# Patient Record
Sex: Female | Born: 1935
Health system: Southern US, Community
[De-identification: ages and names within clinical notes are randomized; demographics above are authoritative.]

## PROBLEM LIST (undated history)

## (undated) DIAGNOSIS — M1990 Unspecified osteoarthritis, unspecified site: Secondary | ICD-10-CM

## (undated) DIAGNOSIS — E663 Overweight: Secondary | ICD-10-CM

## (undated) DIAGNOSIS — I10 Essential (primary) hypertension: Secondary | ICD-10-CM

## (undated) DIAGNOSIS — R002 Palpitations: Secondary | ICD-10-CM

## (undated) DIAGNOSIS — I491 Atrial premature depolarization: Secondary | ICD-10-CM

## (undated) HISTORY — PX: OTHER SURGICAL HISTORY: SHX169

## (undated) HISTORY — PX: CHOLECYSTECTOMY: SHX55

## (undated) HISTORY — DX: Essential (primary) hypertension: I10

## (undated) HISTORY — DX: Palpitations: R00.2

## (undated) HISTORY — DX: Overweight: E66.3

## (undated) HISTORY — PX: TOTAL ABDOMINAL HYSTERECTOMY: SHX209

## (undated) HISTORY — DX: Unspecified osteoarthritis, unspecified site: M19.90

## (undated) HISTORY — DX: Atrial premature depolarization: I49.1

---

## 2003-10-09 ENCOUNTER — Encounter: Payer: Self-pay | Admitting: Cardiology

## 2004-11-27 ENCOUNTER — Ambulatory Visit: Payer: Self-pay | Admitting: Internal Medicine

## 2005-12-15 ENCOUNTER — Ambulatory Visit: Payer: Self-pay | Admitting: Internal Medicine

## 2007-01-19 ENCOUNTER — Ambulatory Visit: Payer: Self-pay | Admitting: Internal Medicine

## 2007-07-31 ENCOUNTER — Encounter: Payer: Self-pay | Admitting: Cardiology

## 2007-08-09 ENCOUNTER — Ambulatory Visit: Payer: Self-pay | Admitting: Internal Medicine

## 2008-01-22 ENCOUNTER — Ambulatory Visit: Payer: Self-pay | Admitting: Internal Medicine

## 2008-09-09 ENCOUNTER — Ambulatory Visit: Payer: Self-pay | Admitting: Internal Medicine

## 2009-02-17 ENCOUNTER — Ambulatory Visit: Payer: Self-pay | Admitting: Internal Medicine

## 2009-02-27 ENCOUNTER — Ambulatory Visit: Payer: Self-pay | Admitting: Internal Medicine

## 2009-08-04 ENCOUNTER — Encounter (INDEPENDENT_AMBULATORY_CARE_PROVIDER_SITE_OTHER): Payer: Self-pay | Admitting: *Deleted

## 2009-08-04 ENCOUNTER — Encounter: Payer: Self-pay | Admitting: Cardiology

## 2009-08-04 LAB — CONVERTED CEMR LAB
AST: 19 units/L
Alkaline Phosphatase: 73 units/L
Calcium: 10 mg/dL
Glucose, Bld: 90 mg/dL
HCT: 38.3 %
HDL: 53.2 mg/dL
LDL Cholesterol: 118.4 mg/dL
MCV: 91.6 fL
Monocytes Relative: 9 %
RDW: 13.8 %
Sodium: 140 meq/L
TSH: 1.321 microintl units/mL
Total Bilirubin: 0.6 mg/dL

## 2009-08-11 ENCOUNTER — Encounter: Payer: Self-pay | Admitting: Cardiology

## 2009-08-26 ENCOUNTER — Ambulatory Visit: Payer: Self-pay | Admitting: Internal Medicine

## 2009-09-04 ENCOUNTER — Ambulatory Visit: Payer: Self-pay | Admitting: Cardiology

## 2009-09-04 ENCOUNTER — Encounter (INDEPENDENT_AMBULATORY_CARE_PROVIDER_SITE_OTHER): Payer: Self-pay | Admitting: *Deleted

## 2009-09-04 DIAGNOSIS — E663 Overweight: Secondary | ICD-10-CM | POA: Insufficient documentation

## 2009-09-04 DIAGNOSIS — I10 Essential (primary) hypertension: Secondary | ICD-10-CM | POA: Insufficient documentation

## 2009-09-04 DIAGNOSIS — Z8679 Personal history of other diseases of the circulatory system: Secondary | ICD-10-CM | POA: Insufficient documentation

## 2009-09-19 ENCOUNTER — Ambulatory Visit: Payer: Self-pay | Admitting: Cardiovascular Disease

## 2009-09-19 ENCOUNTER — Encounter: Payer: Self-pay | Admitting: Cardiology

## 2009-09-19 ENCOUNTER — Ambulatory Visit: Payer: Self-pay

## 2009-09-19 ENCOUNTER — Ambulatory Visit (HOSPITAL_COMMUNITY): Admission: RE | Admit: 2009-09-19 | Discharge: 2009-09-19 | Payer: Self-pay | Admitting: Cardiology

## 2009-10-13 ENCOUNTER — Encounter: Payer: Self-pay | Admitting: Cardiology

## 2009-10-14 ENCOUNTER — Ambulatory Visit: Payer: Self-pay | Admitting: Cardiology

## 2010-03-17 NOTE — Letter (Signed)
Summary: [facility]  - 2005  Myoview - 2005   Imported By: Roderic Ovens 09/15/2009 12:50:29  _____________________________________________________________________  External Attachment:    Type:   Image     Comment:   External Document

## 2010-03-17 NOTE — Assessment & Plan Note (Signed)
Summary: np6/cardiac eval/jml   Visit Type:  Initial Consult Primary Provider:  Conchita Paris, MD  CC:  palpitations.  History of Present Illness: The patient is seen for the evaluation of palpitations.  She describes a sensation of feeling an increase in her heart rate.  She did not get chest pain or shortness of breath.  She mentions that in the past she has taken an increased dose of beta blocker for this and that it has helped.  She is warned monitors in the remote past.  There is no proven coronary disease.  She had a nuclear stress study in 2009 showing a normal ejection fraction and no sign of ischemia.    Current Medications (verified): 1)  Metoprolol Succinate 50 Mg Xr24h-Tab (Metoprolol Succinate) .... Take One Tablet By Mouth in The Am and 1/2 in The Pm 2)  Maxzide-25 37.5-25 Mg Tabs (Triamterene-Hctz) .... Take 1 Tablet By Mouth Once A Day 3)  Osteo Bi-Flex Regular Strength 250-200 Mg Tabs (Glucosamine-Chondroitin) .... Two Times A Day  Allergies (verified): 1)  ! * Bactroban  Past History:  Past Medical History: PACs Palpitations Hypertension Arthritis  degenerative Hysterectomy Cholecystectomy  Family History: Family History of Coronary Artery Disease:  Family History of Diabetes:  Family History of Hypertension:   Review of Systems       Patient denies fever, chills, headache, sweats, rash, change in vision, change in hearing,chest pain, cough, nausea vomiting, urinary symptoms.  All other systems are reviewed and are negative.  Vital Signs:  Patient profile:   77 year old female Height:      66 inches Weight:      223 pounds BMI:     36.12 Pulse rate:   84 / minute BP sitting:   152 / 82  (left arm) Cuff size:   regular  Vitals Entered By: Hardin Negus, RMA (September 04, 2009 11:43 AM)  Physical Exam  General:  patient is overweight but stable. Head:  head is atraumatic. Eyes:  no xanthelasma. Neck:  no jugular venous distention. Chest Wall:  no chest  wall tenderness. Lungs:  lungs are clear.  Respiratory effort is nonlabored. Heart:  cardiac exam reveals S1-S2.  No clicks or significant murmurs. Abdomen:  abdomen is soft. Msk:  no musculoskeletal deformities. Extremities:  no peripheral edema.  She is wearing support hose. Skin:  no skin rashes. Psych:  patient is oriented to person time and place.  Affect is normal.   Impression & Recommendations:  Problem # 1:  PALPITATIONS, HX OF (ICD-V12.50)  Orders: EKG w/ Interpretation (93000) Echocardiogram (Echo) EKG is done and reviewed by me.  I've also compared to prior outside tracings.  She is normal sinus rhythm with first degree AV block.  There is left axis deviation.  There is increased R wave in V1.  It seems that the patient's symptoms may be from sinus tachycardia.  I've chosen not to place a monitor at this time.  We will increase her metoprolol to 50 mg b.i.d.  I will proceed with 2-D echo to reassess LV and RV function.  Recent labs show that the patient's TSH is normal.  Potassium is 4.3 and creatinine 0.8. I will see her for follow.  Problem # 2:  OVERWEIGHT/OBESITY (ICD-278.02) Weight loss would be helpful.  Problem # 3:  HYPERTENSION, UNSPECIFIED (ICD-401.9) Blood pressure is stable today.  Resting heart rate is mildly increased.  We will follow her blood pressure heart rate as we increase her beta blocker dose.  Patient Instructions: 1)  Increase Metoprolol ER to 50mg  two times a day  2)  Your physician has requested that you have an echocardiogram.  Echocardiography is a painless test that uses sound waves to create images of your heart. It provides your doctor with information about the size and shape of your heart and how well your heart's chambers and valves are working.  This procedure takes approximately one hour. There are no restrictions for this procedure. 3)  Follow up in about 6 weeks Prescriptions: METOPROLOL SUCCINATE 50 MG XR24H-TAB (METOPROLOL SUCCINATE)  Take 1 tablet by mouth two times a day  #60 x 6   Entered by:   Meredith Staggers, RN   Authorized by:   Talitha Givens, MD, [facility]   Signed by:   Meredith Staggers, RN on 09/04/2009   Method used:   Electronically to        [facility] [phone]* (retail)       [address]       [place], [place]  [address]       Ph: [phone]       Fax: [phone]   RxID:   [id number]

## 2010-03-17 NOTE — Miscellaneous (Signed)
  Clinical Lists Changes  Observations: Added new observation of PAST MED HX: PACs  (as of 08/2009 holters in past ) Palpitations Hypertension Arthritis  degenerative Hysterectomy Cholecystectomy Nuclear...2009.Marland KitchenMarland KitchenEF normal...no ischemia EF   55-60%...echo..09/19/2009....focal basal hypertrophy Overweight (10/13/2009 9:02) Added new observation of PRIMARY MD: Conchita Paris, MD (10/13/2009 9:02)       Past History:  Past Medical History: PACs  (as of 08/2009 holters in past ) Palpitations Hypertension Arthritis  degenerative Hysterectomy Cholecystectomy Nuclear...2009.Marland KitchenMarland KitchenEF normal...no ischemia EF   55-60%...echo..09/19/2009....focal basal hypertrophy Overweight

## 2010-03-17 NOTE — Letter (Signed)
Summary: [facility] Annual Exam Note   [facility] Exam Note   Imported By: Roderic Ovens 09/15/2009 12:43:43  _____________________________________________________________________  External Attachment:    Type:   Image     Comment:   External Document

## 2010-03-17 NOTE — Assessment & Plan Note (Signed)
Summary: F/U ECHO/D.MILLER   Visit Type:  Follow-up Primary Marsela Kuan:  Kelly Paris, MD  CC:  palpitations.  History of Present Illness: The patient is seen for followup of palpitations.  I saw her on September 04, 2009.  We decided that time to increase the dose of her beta blocker and do a 2-D echo.  The echo revealed normal LV function with an ejection fraction of 60%.  Right ventricular function was normal.  The patient tells me today that she noted that some of her symptoms were related to caffeine intake.  She has cut back on her caffeine and she clearly feels better.  Current Medications (verified): 1)  Metoprolol Succinate 50 Mg Xr24h-Tab (Metoprolol Succinate) .... Take 1 Tablet By Mouth Two Times A Day 2)  Maxzide-25 37.5-25 Mg Tabs (Triamterene-Hctz) .... Take 1 Tablet By Mouth Once A Day 3)  Osteo Bi-Flex Regular Strength 250-200 Mg Tabs (Glucosamine-Chondroitin) .... Two Times A Day  Allergies (verified): 1)  ! * Bactroban  Past History:  Past Medical History: PACs  (as of 08/2009 holters in past ) Palpitations   July, 2011... increase metoprolol to decrease caffeine... improved Hypertension Arthritis  degenerative Hysterectomy Cholecystectomy Nuclear...2009.Marland KitchenMarland KitchenEF normal...no ischemia EF   55-60%...echo..09/19/2009....focal basal hypertrophy Overweight  Review of Systems       Patient denies fever, chills, headache, sweats, rash, change in vision, change in hearing, chest pain, cough, nausea vomiting, urinary symptoms.  All other systems are reviewed and are negative.  Vital Signs:  Patient profile:   77 year old female Height:      66 inches Weight:      225 pounds BMI:     36.45 Pulse rate:   78 / minute BP sitting:   132 / 84  (left arm) Cuff size:   regular  Vitals Entered By: Hardin Negus, RMA (October 14, 2009 10:07 AM)  Physical Exam  General:  patient is stable today. Eyes:  no xanthelasma. Neck:  no jugular venous distention. Lungs:  lungs are clear  cardiorespiratory effort is nonlabored. Heart:  cardiac exam reveals S1-S2.  No clicks or significant murmurs. Abdomen:  abdomen is soft. Extremities:  no peripheral edema.  The patient is wearing her support hose. Psych:  patient is oriented to person time and place her affect is normal.   Impression & Recommendations:  Problem # 1:  PALPITATIONS, HX OF (ICD-V12.50) The patient is improved with increased beta blocker and decrease caffeine.  LV function is normal.  No further workup needed.  Patient is reassured.  Problem # 2:  OVERWEIGHT/OBESITY (ICD-278.02) It of course would be helpful for her to lose weight.  Problem # 3:  HYPERTENSION, UNSPECIFIED (ICD-401.9)  Her updated medication list for this problem includes:    Metoprolol Succinate 50 Mg Xr24h-tab (Metoprolol succinate) .Marland Kitchen... Take 1 tablet by mouth two times a day    Maxzide-25 37.5-25 Mg Tabs (Triamterene-hctz) .Marland Kitchen... Take 1 tablet by mouth once a day Blood pressure is controlled.  No change in therapy.  Other Orders: EKG w/ Interpretation (93000)

## 2010-03-17 NOTE — Miscellaneous (Signed)
  Clinical Lists Changes  Problems: Removed problem of PREMATURE VENTRICULAR CONTRACTIONS (ICD-427.69) Added new problem of PALPITATIONS, HX OF (ICD-V12.50) Observations: Added new observation of PAST MED HX: PACs Palpitations Hypertension Arthritis  degenerative Hysterectomy Cholecystectomy (09/04/2009 9:06) Added new observation of PRIMARY MD: Conchita Paris, MD (09/04/2009 9:06)       Past History:  Past Medical History: PACs Palpitations Hypertension Arthritis  degenerative Hysterectomy Cholecystectomy

## 2010-04-09 ENCOUNTER — Ambulatory Visit: Payer: Self-pay | Admitting: Internal Medicine

## 2010-08-10 ENCOUNTER — Telehealth: Payer: Self-pay | Admitting: Cardiology

## 2010-08-10 DIAGNOSIS — R002 Palpitations: Secondary | ICD-10-CM

## 2010-08-10 NOTE — Telephone Encounter (Addendum)
Called patient back. She advised me that she had a bad spell of rapid heart rate on 6/20 that lasted 20 to 30 minutes after cleaning beans. She did not know how fast her heart rate was. She states that her PCP changed her metoprolol 50 to Atenol 50 for insurance purposes back in January 2012. Advised will discuss with Dr.McLean (DOD) and call her back. Discussed above with Dr.McLean. Since she is not having palps every day she will need an event monitor. He said, since Dr.Katz has no openings until August because he is in [place] so much that he would see the patient in [place] in July. Appointment made for 7/26. Advised patient that she will get a call back about the heart monitor.

## 2010-08-10 NOTE — Telephone Encounter (Signed)
Pt having palpitations and wants to be seen sooner than Dr. Myrtis Ser or Lorin Picket has avail so she wants to talk to someone

## 2010-08-25 ENCOUNTER — Telehealth: Payer: Self-pay | Admitting: Cardiology

## 2010-08-25 NOTE — Telephone Encounter (Signed)
I talked with Windell Moulding. Pt was going to be mailed monitor today. LMTCB

## 2010-08-25 NOTE — Telephone Encounter (Signed)
Per pt call, pt has never received heart monitor in the mail. Please call pt to advise. Pt was told it would take 2-3 days to mail, it has been one week since the heart monitor was supposedly sent.

## 2010-08-25 NOTE — Telephone Encounter (Signed)
I talked with pt. 

## 2010-09-07 ENCOUNTER — Encounter: Payer: Self-pay | Admitting: Cardiology

## 2010-09-10 ENCOUNTER — Encounter: Payer: Self-pay | Admitting: Cardiology

## 2010-09-10 ENCOUNTER — Telehealth: Payer: Self-pay | Admitting: *Deleted

## 2010-09-10 ENCOUNTER — Ambulatory Visit (INDEPENDENT_AMBULATORY_CARE_PROVIDER_SITE_OTHER): Payer: Medicare Other | Admitting: Cardiology

## 2010-09-10 VITALS — BP 152/80 | HR 53 | Ht 65.0 in | Wt 221.0 lb

## 2010-09-10 DIAGNOSIS — I10 Essential (primary) hypertension: Secondary | ICD-10-CM

## 2010-09-10 DIAGNOSIS — R002 Palpitations: Secondary | ICD-10-CM

## 2010-09-10 DIAGNOSIS — Z8679 Personal history of other diseases of the circulatory system: Secondary | ICD-10-CM

## 2010-09-10 NOTE — Telephone Encounter (Signed)
Monitor done 08/21/10-09/10/10 reviewed and signed  by Dr Shirlee Latch. Original report returned to [facility].

## 2010-09-10 NOTE — Progress Notes (Signed)
PCP: Dr. Candelaria Stagers  77 yo with history of PACs presents for evaluation of palpitations.  She was seen by Dr. Myrtis Ser in the past for palpitations with benign workup.  She cut out caffeine and started on atenolol with decrease in symptoms.  About 1 month ago, she had about 25-30 minutes where she felt her heart pound and race.  She did not get lightheaded or pass out.  There was no trigger.  Since then, she has had occasional heart fluttering but no long run of tachypalpitations.  No chest pain.  Mild exertional dyspnea with heavy housework or going up steps.  She is now wearing a 3 week event monitor to look for significant arrhythmia as a cause of her symptoms.  So far, there have been no significant events.  BP was initially high today at 152/80 but decreased to 140/86 with repeat check.    ECG: NSR, LAFB, iRBBB, T wave inversions V1 and V2  Past Medical History:  1. Palpitations: Has had documented PACs.  3 week event monitor in 7/12 with no significant events.  2. Hypertension  3. Osteoarthritis 4. Hysterectomy  5. Cholecystectomy  6. Myoview in 2009 with no ischemia or infarction.  7. Echo (8/11): Focal basal septal hypertrophy, EF 55-60%, no wall motion abnormalities, no significant valvular abnormalities 8. Overweight  History   Social History  . Marital Status: Married    Spouse Name: N/A    Number of Children: N/A  . Years of Education: N/A   Occupational History  . Not on file.   Social History Main Topics  . Smoking status: Never Smoker   . Smokeless tobacco: Never Used   Comment: tobacco use - no  . Alcohol Use: No  . Drug Use: No  . Sexually Active: Not on file   Other Topics Concern  . Not on file   Social History Narrative   Married, retired, Production manager exercises.    Current Outpatient Prescriptions  Medication Sig Dispense Refill  . atenolol (TENORMIN) 50 MG tablet Take 1 tablet by mouth Daily.       . Glucosamine-Chondroitin (OSTEO BI-FLEX REGULAR STRENGTH)  250-200 MG TABS Take by mouth 2 (two) times daily.        Marland Kitchen triamterene-hydrochlorothiazide (MAXZIDE-25) 37.5-25 MG per tablet Take 1 tablet by mouth daily.          BP 152/80  Pulse 53  Ht 5\' 5"  (1.651 m)  Wt 221 lb (100.245 kg)  BMI 36.78 kg/m2 General: NAD, overweight Neck: JVP 8, no thyromegaly or thyroid nodule.  Lungs: Clear to auscultation bilaterally with normal respiratory effort. CV: Nondisplaced PMI.  Heart regular S1/S2, no S3/S4, 1/6 SEM.  No peripheral edema (wearing compression stockings).  No carotid bruit.  Normal pedal pulses.  Abdomen: Soft, nontender, no hepatosplenomegaly, no distention.  Neurologic: Alert and oriented x 3.  Psych: Normal affect. Extremities: No clubbing or cyanosis.

## 2010-09-10 NOTE — Assessment & Plan Note (Signed)
Episode 1 month ago is concerning for a tachyarrhythmia.  So far, no significant events on monitor.  I will have her continue the monitor for the final week.  She will continue her beta blocker.

## 2010-09-10 NOTE — Patient Instructions (Signed)
Schedule an appointment with Dr Myrtis Ser in 2 months.

## 2010-09-10 NOTE — Assessment & Plan Note (Addendum)
BP borderline elevated despite meds.  Needs to be followed closely and may need an additional agent.   Followup Dr. Myrtis Ser in 2 months.

## 2010-10-05 ENCOUNTER — Telehealth: Payer: Self-pay | Admitting: *Deleted

## 2010-10-05 NOTE — Telephone Encounter (Signed)
Dr Shirlee Latch reviewed monitor 08/26/10-09/15/10--mild bradycardia, otherwise OK. Original report signed by Dr Shirlee Latch 09/25/10 returned to [facility].

## 2010-11-11 ENCOUNTER — Ambulatory Visit: Payer: Medicare Other | Admitting: Cardiology

## 2010-11-12 ENCOUNTER — Ambulatory Visit: Payer: Self-pay | Admitting: Internal Medicine

## 2010-11-12 ENCOUNTER — Encounter: Payer: Self-pay | Admitting: Cardiology

## 2011-11-15 ENCOUNTER — Ambulatory Visit: Payer: Self-pay | Admitting: Family Medicine

## 2011-11-25 ENCOUNTER — Ambulatory Visit: Payer: Medicare Other | Admitting: Internal Medicine

## 2011-12-02 ENCOUNTER — Encounter: Payer: Self-pay | Admitting: Cardiology

## 2011-12-02 ENCOUNTER — Ambulatory Visit (INDEPENDENT_AMBULATORY_CARE_PROVIDER_SITE_OTHER): Payer: Medicare Other | Admitting: Cardiology

## 2011-12-02 VITALS — BP 126/71 | HR 58 | Ht 65.0 in | Wt 222.0 lb

## 2011-12-02 DIAGNOSIS — R011 Cardiac murmur, unspecified: Secondary | ICD-10-CM

## 2011-12-02 DIAGNOSIS — Z8679 Personal history of other diseases of the circulatory system: Secondary | ICD-10-CM

## 2011-12-02 DIAGNOSIS — I10 Essential (primary) hypertension: Secondary | ICD-10-CM

## 2011-12-02 LAB — BASIC METABOLIC PANEL
CO2: 31 mEq/L (ref 19–32)
Calcium: 9.6 mg/dL (ref 8.4–10.5)
GFR: 82.35 mL/min (ref >60.00)
Sodium: 137 mEq/L (ref 135–145)

## 2011-12-02 LAB — TSH: TSH: 1.13 u[IU]/mL (ref 0.35–5.50)

## 2011-12-02 NOTE — Patient Instructions (Addendum)
Your physician recommends that you return for lab work in: today  Your physician has requested that you have an echocardiogram. Echocardiography is a painless test that uses sound waves to create images of your heart. It provides your doctor with information about the size and shape of your heart and how well your heart's chambers and valves are working. This procedure takes approximately one hour. There are no restrictions for this procedure.  Your physician has recommended that you wear an event monitor. Event monitors are medical devices that record the heart's electrical activity. Doctors most often [place] these monitors to diagnose arrhythmias. Arrhythmias are problems with the speed or rhythm of the heartbeat. The monitor is a small, portable device. You can wear one while you do your normal daily activities. This is usually used to diagnose what is causing palpitations/syncope (passing out).  Your physician recommends that you continue on your current medications as directed. Please refer to the Current Medication list given to you today.  Your physician wants you to follow-up in: 1 year. You will receive a reminder letter in the mail two months in advance. If you don't receive a letter, please call our office to schedule the follow-up appointment.  If you have runs of palpitations please come by office for a EKG

## 2011-12-03 ENCOUNTER — Other Ambulatory Visit: Payer: Self-pay | Admitting: *Deleted

## 2011-12-03 DIAGNOSIS — E876 Hypokalemia: Secondary | ICD-10-CM

## 2011-12-03 DIAGNOSIS — R011 Cardiac murmur, unspecified: Secondary | ICD-10-CM | POA: Insufficient documentation

## 2011-12-03 MED ORDER — POTASSIUM CHLORIDE CRYS ER 20 MEQ PO TBCR: 20.0000 meq | EXTENDED_RELEASE_TABLET | Freq: Two times a day (BID) | ORAL | Status: DC

## 2011-12-03 NOTE — Progress Notes (Signed)
Patient ID: Kelly Shaffer, female   DOB: 05-27-1933, 78 y.o.   MRN: [id number] PCP: Dr. Yetta Flock ([place])  78 yo with history of PACs presents for evaluation of palpitations.  She wore an event monitor in 7/12 with PACs but no significant arrhythmias.  She has been on atenolol.  About 2 weeks ago, she had several hours of palpitations/fast and irregular heart rate.  No lightheadedness or chest pain.  She had never had an episode of palpitations lasting this long. Since then, she has continued to have occasional palpitations but no long runs.  She has unchanged dyspnea when walking to the mailbox or walking up a flight of steps.  She has pain across her upper back that occurs when she uses her arms but not with walking.   ECG: NSR, 1st degree AV block, LAFB, T wave inversions V1 and V2  Past Medical History:  1. Palpitations: Has had documented PACs.  3 week event monitor in 7/12 with no significant events.  2. Hypertension  3. Osteoarthritis 4. Hysterectomy  5. Cholecystectomy  6. Myoview in 2009 with no ischemia or infarction.  7. Echo (8/11): Focal basal septal hypertrophy, EF 55-60%, no wall motion abnormalities, no significant valvular abnormalities 8. Overweight  History   Social History  . Marital Status: Married    Spouse Name: N/A    Number of Children: N/A  . Years of Education: N/A   Occupational History  . Not on file.   Social History Main Topics  . Smoking status: Never Smoker   . Smokeless tobacco: Never Used   Comment: tobacco use - no  . Alcohol Use: No  . Drug Use: No  . Sexually Active: Not on file   Other Topics Concern  . Not on file   Social History Narrative   Married, retired, lives in [place]   Current Outpatient Prescriptions  Medication Sig Dispense Refill  . atenolol (TENORMIN) 50 MG tablet Take 1 tablet by mouth Daily.       . Glucosamine-Chondroitin (OSTEO BI-FLEX REGULAR STRENGTH) 250-200 MG TABS Take by mouth 2 (two) times daily.        Marland Kitchen  triamterene-hydrochlorothiazide (MAXZIDE-25) 37.5-25 MG per tablet Take 1 tablet by mouth daily.          BP 126/71  Pulse 58  Ht 5\' 5"  (1.651 m)  Wt 222 lb (100.699 kg)  BMI 36.94 kg/m2 General: NAD, overweight Neck: JVP 7, no thyromegaly or thyroid nodule.  Lungs: Clear to auscultation bilaterally with normal respiratory effort. CV: Nondisplaced PMI.  Heart regular S1/S2, no S3/S4, 2/6 SEM.  No peripheral edema.  No carotid bruit.  Normal pedal pulses.  Abdomen: Soft, nontender, no hepatosplenomegaly, no distention.  Neurologic: Alert and oriented x 3.  Psych: Normal affect. Extremities: No clubbing or cyanosis.   Assessment/Plan: 1. Palpitations: Patient had a long run of irregular tachypalpitations lasting for several hours.  She has had no recurrence, just occasional short-lived palpitations.  She has a history of PACs.  My main concern would be for atrial fibrillation.  I will have her wear a 3 week monitor to see if there is any sign of atrial fibrillation.  She will continue atenolol.  Avoid caffeine. I will get a TSH and a BMET.  2. Murmur: Aortic-area systolic murmur.  I will get an echo to evaluate.   Marca Ancona 12/03/2011

## 2011-12-16 ENCOUNTER — Encounter (INDEPENDENT_AMBULATORY_CARE_PROVIDER_SITE_OTHER): Payer: Medicare Other

## 2011-12-16 ENCOUNTER — Ambulatory Visit (HOSPITAL_COMMUNITY): Payer: Medicare Other | Attending: Cardiology

## 2011-12-16 ENCOUNTER — Other Ambulatory Visit (INDEPENDENT_AMBULATORY_CARE_PROVIDER_SITE_OTHER): Payer: Medicare Other | Admitting: *Deleted

## 2011-12-16 DIAGNOSIS — R011 Cardiac murmur, unspecified: Secondary | ICD-10-CM

## 2011-12-16 DIAGNOSIS — R002 Palpitations: Secondary | ICD-10-CM | POA: Insufficient documentation

## 2011-12-16 DIAGNOSIS — I379 Nonrheumatic pulmonary valve disorder, unspecified: Secondary | ICD-10-CM | POA: Insufficient documentation

## 2011-12-16 DIAGNOSIS — I080 Rheumatic disorders of both mitral and aortic valves: Secondary | ICD-10-CM | POA: Insufficient documentation

## 2011-12-16 DIAGNOSIS — Z8679 Personal history of other diseases of the circulatory system: Secondary | ICD-10-CM

## 2011-12-16 DIAGNOSIS — E876 Hypokalemia: Secondary | ICD-10-CM

## 2011-12-16 DIAGNOSIS — I10 Essential (primary) hypertension: Secondary | ICD-10-CM | POA: Insufficient documentation

## 2011-12-16 DIAGNOSIS — I369 Nonrheumatic tricuspid valve disorder, unspecified: Secondary | ICD-10-CM | POA: Insufficient documentation

## 2011-12-16 LAB — BASIC METABOLIC PANEL
CO2: 31 mEq/L (ref 19–32)
Calcium: 9.6 mg/dL (ref 8.4–10.5)
Creatinine, Ser: 0.9 mg/dL (ref 0.4–1.2)
GFR: 61.5 mL/min (ref >60.00)
Glucose, Bld: 96 mg/dL (ref 70–99)

## 2011-12-16 NOTE — Progress Notes (Signed)
Echocardiogram performed.  

## 2011-12-27 ENCOUNTER — Telehealth: Payer: Self-pay | Admitting: Cardiology

## 2011-12-27 NOTE — Telephone Encounter (Signed)
Follow-up:    Patient returned your call.  Please call back. 

## 2011-12-27 NOTE — Telephone Encounter (Signed)
Pt is having a reaction to electrodes and she can not keep them on

## 2011-12-27 NOTE — Telephone Encounter (Signed)
Spoke with pt. Pt states she will not be able to continue monitor due to skin irritation from the electrodes. She was unable to use the cloth, less irritating electrodes the company sent her to try.  She will mail the monitor in tomorrow.

## 2011-12-27 NOTE — Telephone Encounter (Signed)
Spoke with pt. Pt states she is unable to continue monitor because of irritation from electrodes. Even the electrodes that were cloth and  supposed to be less irritating irritated her skin. She is mailing the monitor in tomorrow.

## 2011-12-27 NOTE — Telephone Encounter (Signed)
LMTCB

## 2011-12-27 NOTE — Telephone Encounter (Signed)
FYI ; [facility] calling to let us know pt removed heart monitor today, was not due to remove until 01-05-12 , due to irritation , was sent pediatric strips for sensitive skin and still had irritation

## 2012-01-04 ENCOUNTER — Telehealth: Payer: Self-pay | Admitting: *Deleted

## 2012-01-04 NOTE — Telephone Encounter (Signed)
Dr Shirlee Latch reviewed monitor done 12/16/11-12/27/11. No significant arrhythmia. Pt notified.

## 2012-03-14 ENCOUNTER — Institutional Professional Consult (permissible substitution): Payer: Medicare Other | Admitting: Pulmonary Disease

## 2012-03-27 ENCOUNTER — Ambulatory Visit: Payer: Self-pay | Admitting: Specialist

## 2012-07-07 ENCOUNTER — Telehealth: Payer: Self-pay | Admitting: Cardiology

## 2012-07-07 NOTE — Telephone Encounter (Signed)
Spoke with pt. Pt states for several weeks she has had palpitations. Pt requesting appt. I have given pt appt 07/12/12 with PA.

## 2012-07-07 NOTE — Telephone Encounter (Signed)
New problem    C/O sob , irregular heart rate

## 2012-07-12 ENCOUNTER — Ambulatory Visit (INDEPENDENT_AMBULATORY_CARE_PROVIDER_SITE_OTHER): Payer: Medicare Other | Admitting: Physician Assistant

## 2012-07-12 ENCOUNTER — Encounter: Payer: Self-pay | Admitting: Physician Assistant

## 2012-07-12 VITALS — BP 148/87 | HR 65 | Ht 65.0 in | Wt 226.0 lb

## 2012-07-12 DIAGNOSIS — R002 Palpitations: Secondary | ICD-10-CM

## 2012-07-12 DIAGNOSIS — I10 Essential (primary) hypertension: Secondary | ICD-10-CM

## 2012-07-12 DIAGNOSIS — R0989 Other specified symptoms and signs involving the circulatory and respiratory systems: Secondary | ICD-10-CM

## 2012-07-12 DIAGNOSIS — Z8679 Personal history of other diseases of the circulatory system: Secondary | ICD-10-CM

## 2012-07-12 MED ORDER — ATENOLOL 50 MG PO TABS: 75.0000 mg | ORAL_TABLET | Freq: Every day | ORAL | Status: DC

## 2012-07-12 NOTE — Assessment & Plan Note (Signed)
Patient has long history of palpitations currently being treated with atenolol. He seemed to have worsened and there was concern whether she was having some atrial fibrillation in the past. She was unable to wear and the event recorder because of allergy to the pads. Her palpitations are worse with exertion. I will order a stress test to rule out any arrhythmias. We'll increase her Tenormin to 75 mg daily. She will follow up with Dr. Shirlee Latch in one month. She has been on diltiazem in the past and wonders if this would work better. She can discuss this with him.

## 2012-07-12 NOTE — Patient Instructions (Addendum)
PLEASE SCHEDULE A GXT; DX 785.1   INCREASE ATENOLOL 75 MG DAILY; THIS WILL BE 1 AND 1/2 TABLETS DAILY  PLEASE FOLLOW UP WITH DR. Shirlee Latch IN ABOUT 1 MONTH

## 2012-07-12 NOTE — Progress Notes (Signed)
HPI:   This is a 79 year old female patient of Dr.McLean who has a long standing history of tachycardia palpitations. He tried evaluating her with an event monitor back in November but she was allergic to the pads. There were no sick significant arrhythmia for the time she wore it. She was worried she was having some breakthrough atrial fibrillation but with this was never documented.She has history of normal stress Myoview in 2009. 2-D echo in 10/13 showed mild LVH, EF of 6065%. She had some diastolic dysfunction mild MR.  The patient comes in today complaining of worsening palpitations. She said they happen whenever she exerts herself. She becomes out of breath but it goes away quickly when she stops her activity. In the office today she states she feels like it's beating irregular and the feeling that she has after after a more severe episode. She is in sinus rhythm today while she is having these symptoms. She had normal TSH back in November. She is avoiding caffeine but says caffeine does make it worse. She is not taking any over-the-counter medications. She states that she is to take diltiazem but can't remember why this was changed to atenolol.   Allergies: -- Mupirocin   -- Zyrtec (Cetirizine)    --  OTC: feels like having heart attack  Current Outpatient Prescriptions on File Prior to Visit: atenolol (TENORMIN) 50 MG tablet, Take 1 tablet by mouth Daily. , Disp: , Rfl:  Glucosamine-Chondroitin (OSTEO BI-FLEX REGULAR STRENGTH) 250-200 MG TABS, Take by mouth 2 (two) times daily.  , Disp: , Rfl:  triamterene-hydrochlorothiazide (MAXZIDE-25) 37.5-25 MG per tablet, Take 1 tablet by mouth daily.  , Disp: , Rfl:   No current facility-administered medications on file prior to visit.   Past Medical History:   PAC (premature atrial contraction)                             Comment:as of 7/11; holters in past   Palpitations                                                   Comment:7/11. increast  metoprolol to decerase caffeine.              improved   HTN (hypertension)                                           Arthritis                                                      Comment:degenerative   Overweight(278.02)                                          Past Surgical History:   TOTAL ABDOMINAL HYSTERECTOMY  CHOLECYSTECTOMY                                               focal basal hypertrophy                                         Comment:EF 55-60%. echo 09/19/09  Review of patient's family history indicates:   Coronary artery disease                                   Comment: family hx   Diabetes                                                  Comment: family hx   Hypertension                                              Comment: family hx   Social History   Marital Status: Married             Spouse Name:                      Years of Education:                 Number of children:             Occupational History   None on file  Social History Main Topics   Smoking Status: Never Smoker                     Smokeless Status: Never Used                       Comment: tobacco use - no   Alcohol Use: No             Drug Use: No             Sexual Activity: Not on file        Other Topics            Concern   None on file  Social History Narrative   Married, retired, Production manager exercises.     ROS:see history of present illness otherwise negative   PHYSICAL EXAM: B., in no acute distress. Neck: No JVD, HJR, Bruit, or thyroid enlargement  Lungs: No tachypnea, clear without wheezing, rales, or rhonchi  Cardiovascular: RRR, PMI not displaced, Doris 6 systolic murmur left sternal border, no gallops, bruit, thrill, or heave.  Abdomen: BS normal. Soft without organomegaly, masses, lesions or tenderness.  Extremities: +1 bilateral ankle edema which is chronic, otherwise lower extremities without cyanosis, clubbing. Decreased  distal pulses bilateral  SKin: Warm, no lesions or rashes   Musculoskeletal: No deformities  Neuro: no focal signs  BP 148/87  Pulse 65  Ht 5\' 5"  (1.651 m)  Wt 226 lb (102.513 kg)  BMI 37.61 kg/m2   UJW:JXBJYN sinus  rhythm with first degree AV block, LVH and nonspecific ST-T wave changes  2Decho 10/13: Study Conclusions  - Left ventricle: The cavity size was normal. Wall thickness   was increased in a pattern of mild LVH. There was mild   focal basal hypertrophy of the septum. Systolic function   was normal. The estimated ejection fraction was in the   range of 60% to 65%. Wall motion was normal; there were no   regional wall motion abnormalities. Doppler parameters are   consistent with abnormal left ventricular relaxation   (grade 1 diastolic dysfunction). The LV outflow tract was   narrowed with turbulent flow. A significant gradient was   not measured. - Aortic valve: There was no stenosis. Trivial   regurgitation. - Mitral valve: No mitral valve systolic anterior motion.   Mild regurgitation. - Left atrium: The atrium was mildly dilated. - Right ventricle: The cavity size was normal. Systolic   function was normal. - Pulmonary arteries: No complete TR doppler jet so unable   to estimate PA systolic pressure. - Inferior vena cava: The vessel was normal in size; the   respirophasic diameter changes were in the normal range (=   50%); findings are consistent with normal central venous   pressure. Impressions:  - Normal LV size with focal basal septal hypertrophy. EF   60-65%. There was no mitral valve SAM but there was mild   MR. There was turbulence across the narrowed LVOT, likely   causing the murmur. No significant LVOT gradient was   measured. Normal RV size and systolic function. Transthoracic echocardiography.  M-mode, complete 2D, spectral Doppler, and color Doppler.  Height:  Height: 165.1cm. Height: 65in.  Weight:  Weight: 100.7kg. Weight: 221.5lb.   Body mass index:  BMI: 36.9kg/m^2.  Body surface area:    BSA: 2.6m^2.  Blood pressure:     126/71.  Patient status:  Outpatient.  Location:  [facility]

## 2012-07-12 NOTE — Assessment & Plan Note (Signed)
Stable

## 2012-08-15 NOTE — Addendum Note (Signed)
Addended by: Tarri Fuller on: 08/15/2012 04:46 PM   Modules accepted: Orders

## 2012-08-16 ENCOUNTER — Ambulatory Visit (INDEPENDENT_AMBULATORY_CARE_PROVIDER_SITE_OTHER): Payer: Medicare Other | Admitting: Nurse Practitioner

## 2012-08-16 DIAGNOSIS — R0609 Other forms of dyspnea: Secondary | ICD-10-CM

## 2012-08-16 DIAGNOSIS — R002 Palpitations: Secondary | ICD-10-CM

## 2012-08-16 DIAGNOSIS — R0989 Other specified symptoms and signs involving the circulatory and respiratory systems: Secondary | ICD-10-CM

## 2012-08-16 NOTE — Progress Notes (Deleted)
Exercise Treadmill Test  Pre-Exercise Testing Evaluation Rhythm: sinus bradycardia  Rate: 56     Test  Exercise Tolerance Test Ordering MD: Valera Castle, MD  Interpreting MD: Norma Fredrickson, NP  Unique Test No: 1  Treadmill:  1  Indication for ETT: exertional dyspnea  Contraindication to ETT: No   Stress Modality: exercise - treadmill  Cardiac Imaging Performed: non   Protocol: standard Bruce - maximal  Max BP:  ***/***  Max MPHR (bpm):  144 85% MPR (bpm):  122  MPHR obtained (bpm):  *** % MPHR obtained:  ***  Reached 85% MPHR (min:sec):  *** Total Exercise Time (min-sec):  ***  Workload in METS:  *** Borg Scale: ***  Reason ETT Terminated:  {CHL REASON TERMINATED FOR ZOX:09604540}    ST Segment Analysis At Rest: {CHL ST SEGMENT AT REST FOR JWJ:19147829} With Exercise: {CHL ST SEGMENT WITH EXERCISE FOR FAO:13086578}  Other Information Arrhythmia:  {CHL ARRHYTHMIA FOR ION:62952841} Angina during ETT:  {CHL ANGINA DURING LKG:40102725} Quality of ETT:  {CHL QUALITY OF DGU:44034742}  ETT Interpretation:  {CHL INTERPRETATION FOR VZD:63875643}  Comments: ***  Recommendations: ***

## 2012-08-16 NOTE — Progress Notes (Signed)
Charlett Chojnowski Date of Birth: [birth year] Medical Record #[id number]  History of Present Illness: Ms. Schmale is seen today for what was to be a GXT visit. Seen for Dr. Shirlee Latch. She has had DOE and palpitations. No atrial fib documented on prior event monitor but the study was limited due to allergy to electrodes. She has had a normal stress Myoview in 2009, normal EF and diastolic dysfunction. She remains obese.   Seen back in May. Complaining of exertional palpitations. Her tenormin was increased. GXT study was arranged to rule out exercise induced arrhythmia.   Comes in today. Quite apprehensive about walking on the treadmill. She is quite sedentary. Does not exercise. Since her medicine was increased, she has felt better and has no more palpitations. She remains short of breath with exertion but is obese as well and very deconditioned. Limited by her arthritis as well.   Current Outpatient Prescriptions  Medication Sig Dispense Refill  . atenolol (TENORMIN) 50 MG tablet Take 1.5 tablets (75 mg total) by mouth daily.  45 tablet  6  . Glucosamine-Chondroitin (OSTEO BI-FLEX REGULAR STRENGTH) 250-200 MG TABS Take by mouth 2 (two) times daily.        . potassium chloride SA (K-DUR,KLOR-CON) 20 MEQ tablet Take 20 mEq by mouth daily.      Marland Kitchen triamterene-hydrochlorothiazide (MAXZIDE-25) 37.5-25 MG per tablet Take 1 tablet by mouth daily.         No current facility-administered medications for this visit.    Allergies  Allergen Reactions  . Mupirocin   . Zyrtec (Cetirizine)     OTC: feels like having heart attack    Past Medical History  Diagnosis Date  . PAC (premature atrial contraction)     as of 7/11; holters in past  . Palpitations     7/11. increast metoprolol to decerase caffeine. improved  . HTN (hypertension)   . Arthritis     degenerative  . Overweight(278.02)     Past Surgical History  Procedure Laterality Date  . Total abdominal hysterectomy    . Cholecystectomy    .  Focal basal hypertrophy      EF 55-60%. echo 09/19/09    History  Smoking status  . Never Smoker   Smokeless tobacco  . Never Used    Comment: tobacco use - no    History  Alcohol Use No    Family History  Problem Relation Age of Onset  . Coronary artery disease      family hx  . Diabetes      family hx  . Hypertension      family hx    Review of Systems: The review of systems is per the HPI.  All other systems were reviewed and are negative.  Physical Exam: There were no vitals taken for this visit. She was not examined today.   LABORATORY DATA:   Assessment / Plan: Palpitations - that were exertional in nature but now resolved with increase in beta blocker therapy. She notes that her symptoms have improved. She is not really wanting to proceed with GXT and I would have to agree. She looks deconditioned to me and I do not think this will be very helpful. We will leave her on her current regimen. See Dr. Shirlee Latch back as planned. She is to call if she has further issues. I did encourage her to try and walk more.   Patient is agreeable to this plan and will call if any problems develop in the interim.  Rosalio Macadamia, RN, ANP-C [facility] [address] [place], [place]  [address]

## 2012-09-27 ENCOUNTER — Telehealth: Payer: Self-pay | Admitting: Cardiology

## 2012-09-27 DIAGNOSIS — R002 Palpitations: Secondary | ICD-10-CM

## 2012-09-27 NOTE — Telephone Encounter (Signed)
That would be fine 

## 2012-09-27 NOTE — Telephone Encounter (Signed)
Spoke with patient. Pt states about a week  ago, she experienced a increase frequency in palpitations. She took extra atenolol 25mg  with relief prn.  In the last week her palpitations have not been bad. Pt is not having palpitations at this time.  Pt denies lightheadedness or dizziness. I have scheduled patient first available appt with PA/NP 10/10/12.

## 2012-09-27 NOTE — Telephone Encounter (Signed)
Pt asking if it would be OK to  increase atenolol to 50mg  bid instead of 75mg  in the AM until appt 10/10/12. I will forward to Dr Shirlee Latch.

## 2012-09-27 NOTE — Telephone Encounter (Signed)
New prob   Pt states she is not feeling well and would like to speak with a nurse.

## 2012-09-28 MED ORDER — ATENOLOL 50 MG PO TABS: 50.0000 mg | ORAL_TABLET | Freq: Two times a day (BID) | ORAL | Status: DC

## 2012-09-28 NOTE — Telephone Encounter (Signed)
Pt aware continue medication increase as discussed - RX to be sent into [facility] in [place].  She will keep her appt as scheduled 8/26

## 2012-09-28 NOTE — Addendum Note (Signed)
Addended by: Sharin Grave on: 09/28/2012 09:49 AM   Modules accepted: Orders

## 2012-10-10 ENCOUNTER — Encounter: Payer: Self-pay | Admitting: Physician Assistant

## 2012-10-10 ENCOUNTER — Ambulatory Visit (INDEPENDENT_AMBULATORY_CARE_PROVIDER_SITE_OTHER): Payer: Medicare Other | Admitting: Physician Assistant

## 2012-10-10 VITALS — BP 149/91 | HR 57 | Ht 66.0 in | Wt 225.0 lb

## 2012-10-10 DIAGNOSIS — I10 Essential (primary) hypertension: Secondary | ICD-10-CM

## 2012-10-10 DIAGNOSIS — R0683 Snoring: Secondary | ICD-10-CM

## 2012-10-10 DIAGNOSIS — R0602 Shortness of breath: Secondary | ICD-10-CM

## 2012-10-10 DIAGNOSIS — R002 Palpitations: Secondary | ICD-10-CM

## 2012-10-10 NOTE — Progress Notes (Signed)
[address] [place], [place]  [address] Phone: [phone] Fax:  [phone]  Date:  10/10/2012   ID:  Kelly Shaffer, DOB 01-May-1933, MRN [id number]  PCP:  [name], MD  Cardiologist:  Dr. Marca Ancona     History of Present Illness: Kelly Shaffer is a 79 y.o. female who returns for evaluation of palpitations.   She has a hx of HTN, PACs. She wore an event monitor in 7/12 with PACs but no significant arrhythmias. She has been on atenolol.  She was seen by Dr. Shirlee Latch in 11/2011 with recurrent palpitations.  Echocardiogram 10/13: Mild LVH, mild focal basal septal hypertrophy, EF 60-65%, normal wall motion, grade 1 diastolic dysfunction, turbulence across a narrowed LVOT but no significant gradient, no mitral valve SAM, trivial AI, mild MR, mild LAE.  Event monitor 10/13:  Normal sinus rhythm, no significant arrhythmias.  She only wore this for 12 days due to allergy to the pads.  She was seen in 5/14 with worsening palpitations.  ETT was arranged to rule out arrhythmia. Beta blocker was adjusted. The patient presented for ETT, but she was apprehensive to proceed. Her symptoms had improved and it was decided to forego the stress test.  She notes increasing palpitations recently.  She describes them as a rapid HR.  No precipitating factors.  Symptoms may last several minutes.  She feels weak with this and feels her heart racing in her chest.  She denies syncope or near syncope. She does note assoc SOB.  She notes DOE, NYHA Class IIb symptoms.  Has seen pulmonology in [place] and has been told she does not have COPD.  She denies orthopnea, PND, significant LE edema.  No syncope.  She increased her atenolol to 50 bid recently and has not had a recurrence.  Of note, her daughter is a Charity fundraiser and she listened to her one time and felt her heart beat was irregular.  Her pulse oximeter clocked her HR at 140 on one occasion.    Labs (10/13):  K 3.6, Cr 0.9, TSH 1.13  Wt Readings from  Last 3 Encounters:  10/10/12 225 lb (102.059 kg)  07/12/12 226 lb (102.513 kg)  12/02/11 222 lb (100.699 kg)    Past Medical History:  1. Palpitations: Has had documented PACs. 3 week event monitor in 7/12 with no significant events. Event monitor 10/13: no arrhythmia, worn for 12 days due to allergy 2. Hypertension  3. Osteoarthritis  4. Hysterectomy  5. Cholecystectomy  6. Myoview in 2009 with no ischemia or infarction.  7. Echo (8/11): Focal basal septal hypertrophy, EF 55-60%, no wall motion abnormalities, no significant valvular abnormalities.   Echocardiogram 10/13: Mild LVH, mild focal basal septal hypertrophy, EF 60-65%, normal wall motion, grade 1 diastolic dysfunction, turbulence across a narrowed LVOT but no significant gradient, no mitral valve SAM, trivial AI, mild MR, mild LAE 8. Overweight   Current Outpatient Prescriptions  Medication Sig Dispense Refill  . atenolol (TENORMIN) 50 MG tablet Take 1 tablet (50 mg total) by mouth 2 (two) times daily.  60 tablet  6  . Glucosamine-Chondroitin (OSTEO BI-FLEX REGULAR STRENGTH) 250-200 MG TABS Take by mouth 2 (two) times daily.        . potassium chloride SA (K-DUR,KLOR-CON) 20 MEQ tablet Take 20 mEq by mouth daily.      Marland Kitchen triamterene-hydrochlorothiazide (MAXZIDE-25) 37.5-25 MG per tablet Take 1 tablet by mouth daily.         No current facility-administered medications  for this visit.    Allergies:    Allergies  Allergen Reactions  . Mupirocin   . Zyrtec [Cetirizine]     OTC: feels like having heart attack    Social History:  The patient  reports that she has never smoked. She has never used smokeless tobacco. She reports that she does not drink alcohol or use illicit drugs.   ROS:  Please see the history of present illness.   She notes significant snoring hx.   All other systems reviewed and negative.   PHYSICAL EXAM: VS:  BP 149/91  Pulse 57  Ht 5\' 6"  (1.676 m)  Wt 225 lb (102.059 kg)  BMI 36.33 kg/m2 Well  nourished, well developed, in no acute distress HEENT: normal Neck: no JVD Vascular:  No carotid bruits bilat Cardiac:  normal S1, S2; RRR; no murmur Lungs:  clear to auscultation bilaterally, no wheezing, rhonchi or rales Abd: soft, nontender, no hepatomegaly Ext: no edema Skin: warm and dry Neuro:  CNs 2-12 intact, no focal abnormalities noted  EKG:  Sinus brady, HR 57, LAD, NSSTW changes     ASSESSMENT AND PLAN:  1. Palpitations:  She gives a good description for AFib.  However, this has never been documented.  I checked with EP today to see if she could have an ILR but she is not a candidate for this.  I reviewed with our monitor tech.  We can get her a hypoallergenic unit by ordering an E-cardio Braemer.  Will will therefore proceed with an event monitor.  The frequency of her events are so limited that a Holter will not be sufficient.  CHADS2-VASc=4.  She would need anticoagulation if AFib is dx'd.  2. Snoring:  Arrange sleep study. 3. Hypertension:  Repeat BP 130/80.  Continue current Rx. 4. Dyspnea:  Suspect related to deconditioning and obesity.  If AFib identified, she will need stress testing to screen for ischemic heart disease as she will likely need an antiarrhythmic drug. 5. Disposition:  F/u with Dr. Marca Ancona in October as planned.   Signed, Tereso Newcomer, PA-C  10/10/2012 3:45 PM

## 2012-10-10 NOTE — Patient Instructions (Addendum)
OK TO TAKE EXTRA 1/2 TABLET OF ATENOLOL AS NEEDED FOR BREAK THROUGH PALPITATIONS  PLEASE SCHEDULE TO HAVE AN EVENT MONITOR PLACED ; E-CARDIO BRAEMER PER [place] IN MONITOR ROOM  KEEP YOUR FOLLOW UP APPT WITH DR. Shirlee Latch IN 11/2012  SLEEP STUDY TO BE DONE AT [place]; DX SNORING

## 2012-10-11 ENCOUNTER — Encounter (INDEPENDENT_AMBULATORY_CARE_PROVIDER_SITE_OTHER): Payer: Medicare Other

## 2012-10-11 ENCOUNTER — Encounter: Payer: Self-pay | Admitting: *Deleted

## 2012-10-11 DIAGNOSIS — R002 Palpitations: Secondary | ICD-10-CM

## 2012-10-11 NOTE — Progress Notes (Signed)
Patient ID: Kelly Shaffer, female   DOB: 10/12/1933, 79 y.o.   MRN: [id number] E-Cardio Braemer 30 day cardiac event monitor placed on patient. Patient has history of allergic reaction to electrodes used with a [employer] monitor in the past. E-Cardio Braemer uses 2 electrodes instead of 4 and also has electrodes for sensitive skin.  E-Cardio to mail patient pediatric sized electrodes in case of sensitivity.

## 2012-11-28 ENCOUNTER — Other Ambulatory Visit: Payer: Self-pay | Admitting: Cardiology

## 2012-12-06 ENCOUNTER — Ambulatory Visit: Payer: Self-pay | Admitting: Internal Medicine

## 2012-12-08 ENCOUNTER — Encounter: Payer: Self-pay | Admitting: Cardiology

## 2012-12-08 ENCOUNTER — Ambulatory Visit (INDEPENDENT_AMBULATORY_CARE_PROVIDER_SITE_OTHER): Payer: Medicare Other | Admitting: Cardiology

## 2012-12-08 VITALS — BP 130/70 | HR 60 | Ht 66.0 in | Wt 225.0 lb

## 2012-12-08 DIAGNOSIS — R011 Cardiac murmur, unspecified: Secondary | ICD-10-CM

## 2012-12-08 DIAGNOSIS — I10 Essential (primary) hypertension: Secondary | ICD-10-CM

## 2012-12-08 DIAGNOSIS — R0609 Other forms of dyspnea: Secondary | ICD-10-CM

## 2012-12-08 DIAGNOSIS — I4891 Unspecified atrial fibrillation: Secondary | ICD-10-CM

## 2012-12-08 LAB — CBC WITH DIFFERENTIAL/PLATELET
Basophils Absolute: 0.1 10*3/uL (ref 0.0–0.1)
Eosinophils Absolute: 0.4 10*3/uL (ref 0.0–0.7)
HCT: 36 % (ref 36.0–46.0)
Lymphs Abs: 1.9 10*3/uL (ref 0.7–4.0)
MCV: 90.9 fl (ref 78.0–100.0)
Monocytes Absolute: 0.7 10*3/uL (ref 0.1–1.0)
Platelets: 255 10*3/uL (ref 150.0–400.0)
RDW: 14.3 % (ref 11.5–14.6)

## 2012-12-08 LAB — BASIC METABOLIC PANEL
BUN: 16 mg/dL (ref 6–23)
CO2: 32 mEq/L (ref 19–32)
Chloride: 99 mEq/L (ref 96–112)
Creatinine, Ser: 0.8 mg/dL (ref 0.4–1.2)

## 2012-12-08 LAB — BRAIN NATRIURETIC PEPTIDE: Pro B Natriuretic peptide (BNP): 136 pg/mL — ABNORMAL HIGH (ref 0.0–100.0)

## 2012-12-08 LAB — LIPID PANEL
HDL: 65.9 mg/dL (ref >39.00)
Total CHOL/HDL Ratio: 3
VLDL: 17 mg/dL (ref 0.0–40.0)

## 2012-12-08 MED ORDER — APIXABAN 5 MG PO TABS: 5.0000 mg | ORAL_TABLET | Freq: Two times a day (BID) | ORAL | Status: DC

## 2012-12-08 MED ORDER — DRONEDARONE HCL 400 MG PO TABS: 400.0000 mg | ORAL_TABLET | Freq: Two times a day (BID) | ORAL | Status: DC

## 2012-12-08 NOTE — Patient Instructions (Signed)
Start Eliquis 5mg  two times a day.   Start Multaq 400mg  two times a day WITH FOOD.  Your physician recommends that you have lab work BMET/BNP/CBCd/Lipid profile.  Your physician recommends that you schedule a follow-up appointment with Dr Shirlee Latch on Monday December 1,2014 at 10:30AM.

## 2012-12-10 DIAGNOSIS — I4891 Unspecified atrial fibrillation: Secondary | ICD-10-CM | POA: Insufficient documentation

## 2012-12-10 NOTE — Progress Notes (Signed)
Patient ID: Kelly Shaffer, female   DOB: [birth year], 79 y.o.   MRN: [id number] PCP: Dr. Thedore Mins  79 yo with history of palpitations presents for cardiology followup.  She wore an event monitor in 12/13 with no significant arrhythmia noted.  She continued to have symptoms.  She wore a monitor again in 9/14, this showed 2 short runs of irregular tachycardia that looks like atrial fibrillation.  She continues to have feel fluttering in her chest but not as bad lately.  She will feet occasional rapid, irregular HR for < 1 minute.  A few months ago, she was having prolonged runs of tachypalpitations.   Last echo in 10/13 showed mild focal basal septal hypertrophy.  There was no SAM but there was LV outflow tract turbulence.  No significant gradient measured.  Of note, she had a chest CT in 2/14 that showed coronary calcification.   She has chronic mild DOE after walking 50 yards.  She is short of breath with steps.  No chest pain.   Past Medical History:  1. Atrial fibrillation: Has had documented PACs.  3 week event monitor in 7/12 with no significant events. 3 week monitor in 12/13 with no significant arrhythmias.  3 week monitor in 9/14 with 2 short runs of atrial fibrillation.  2. Hypertension  3. Osteoarthritis 4. Hysterectomy  5. Cholecystectomy  6. Myoview in 2009 with no ischemia or infarction.  7. Echo (8/11): Focal basal septal hypertrophy, EF 55-60%, no wall motion abnormalities, no significant valvular abnormalities.  Echo (10/13) with EF 60-65%, mild focal basal septal hypertrophy, no SAM, LVOT turbulence with no gradient.   8. Overweight 9. CAD: Coronary calcification on 2/14 chest CT.   History   Social History  . Marital Status: Married    Spouse Name: N/A    Number of Children: N/A  . Years of Education: N/A   Occupational History  . Not on file.   Social History Main Topics  . Smoking status: Never Smoker   . Smokeless tobacco: Never Used   Comment: tobacco use - no  . Alcohol  Use: No  . Drug Use: No  . Sexually Active: Not on file   Other Topics Concern  . Not on file   Social History Narrative   Married, retired, lives in [place]   Current Outpatient Prescriptions  Medication Sig Dispense Refill  . apixaban (ELIQUIS) 5 MG TABS tablet Take 1 tablet (5 mg total) by mouth 2 (two) times daily.  60 tablet  2  . atenolol (TENORMIN) 50 MG tablet Take 1 tablet (50 mg total) by mouth 2 (two) times daily.  60 tablet  6  . dronedarone (MULTAQ) 400 MG tablet Take 1 tablet (400 mg total) by mouth 2 (two) times daily with a meal.  60 tablet  2  . Glucosamine-Chondroitin (OSTEO BI-FLEX REGULAR STRENGTH) 250-200 MG TABS Take by mouth 2 (two) times daily.        . potassium chloride SA (K-DUR,KLOR-CON) 20 MEQ tablet Take 1 tablet (20 mEq total) by mouth daily.  60 tablet  6  . triamterene-hydrochlorothiazide (MAXZIDE-25) 37.5-25 MG per tablet Take 1 tablet by mouth daily.         No current facility-administered medications for this visit.    BP 130/70  Pulse 60  Ht 5\' 6"  (1.676 m)  Wt 102.059 kg (225 lb)  BMI 36.33 kg/m2 General: NAD, overweight Neck: JVP 7, no thyromegaly or thyroid nodule.  Lungs: Clear to auscultation bilaterally with normal respiratory  effort. CV: Nondisplaced PMI.  Heart regular S1/S2, no S3/S4, 1/6 SEM.  Trace ankle edema.  No carotid bruit.  Normal pedal pulses.  Abdomen: Soft, nontender, no hepatosplenomegaly, no distention.  Neurologic: Alert and oriented x 3.  Psych: Normal affect. Extremities: No clubbing or cyanosis.   Assessment/Plan: 1. Atrial fibrillation: Patient does appear to have paroxysmal atrial fibrillation.  She is symptomatic with afib runs.   - Start apixaban 5 mg bid.  Check CBC and BMET with initiation of apixaban.  - Given significant symptoms with atrial fibrillation runs, will start dronedarone 400 mg bid to be taken with food.  - Continue atenolol.  2. Murmur: Aortic-area systolic murmur.  I suspect this is due to  LVOT turbulence noted on echo.  No significan gradient was measured.  3. CAD: Coronary calcium noted on CT earlier this year.  I think she ought to be on a statin.  I will check lipids and start a statin based on LDL level.  4. HTN: BP is controlled.   Marca Ancona 12/10/2012

## 2012-12-12 ENCOUNTER — Telehealth: Payer: Self-pay | Admitting: *Deleted

## 2012-12-12 DIAGNOSIS — I4891 Unspecified atrial fibrillation: Secondary | ICD-10-CM

## 2012-12-12 MED ORDER — ATORVASTATIN CALCIUM 10 MG PO TABS: 10.0000 mg | ORAL_TABLET | Freq: Every day | ORAL | Status: AC

## 2012-12-12 NOTE — Telephone Encounter (Signed)
Pt states she has been having nausea starting the day after she began taking Multaq. Pt denies other symptoms.  I confirmed with patient that she was taking it with food. I will forward to Dr Shirlee Latch for review.

## 2012-12-12 NOTE — Telephone Encounter (Signed)
Would try it for a few days, if the nausea is from the medication it may pass as she gets used to it.  If she still has trouble after 2-3 days, let us know.

## 2012-12-14 ENCOUNTER — Telehealth: Payer: Self-pay | Admitting: *Deleted

## 2012-12-14 NOTE — Telephone Encounter (Signed)
Spoke with pt and she states nausea has been better yesterday and so far today. She will continue Multaq and let me know if she has more nausea and cannot tolerate Multaq.

## 2012-12-14 NOTE — Telephone Encounter (Signed)
[employer] rx approval for eliquis for 1 year from this date, PA #[id number], she did a trial run through pharmacy without problems, patient has been instructed to use her eliquis cards first for a 30 day free trial then initiate buying eliquis through insurance, I left a message for patient regarding this.

## 2012-12-14 NOTE — Telephone Encounter (Signed)
Pt states she was told by her pharmacy that they needed more information before they could fill Eliquis. I will forward to Patient Advocate RN to follow up on this for pt

## 2013-01-03 ENCOUNTER — Telehealth: Payer: Self-pay | Admitting: Cardiology

## 2013-01-03 NOTE — Telephone Encounter (Signed)
New Problems  Pt states the newly prescribed medication makes her nauseated/// And cannot continue to take the medication.// requests a call back to discuss.

## 2013-01-04 NOTE — Telephone Encounter (Signed)
Pt states she has continued to have nausea associated with Multaq. The nausea started a day or so after she started Multaq and it has not improved. She stopped Multaq yesterday. I will forward to Dr Shirlee Latch for review.

## 2013-01-04 NOTE — Telephone Encounter (Signed)
Stop the Multaq.  We'll see how she does off it.  Continue apixaban.

## 2013-01-04 NOTE — Telephone Encounter (Signed)
Not at home. 

## 2013-01-04 NOTE — Telephone Encounter (Signed)
Pt advised.

## 2013-01-10 ENCOUNTER — Encounter: Payer: Self-pay | Admitting: *Deleted

## 2013-01-15 ENCOUNTER — Encounter: Payer: Self-pay | Admitting: Cardiology

## 2013-01-15 ENCOUNTER — Ambulatory Visit (INDEPENDENT_AMBULATORY_CARE_PROVIDER_SITE_OTHER): Payer: Medicare Other | Admitting: Cardiology

## 2013-01-15 VITALS — BP 122/80 | HR 51 | Ht 66.0 in | Wt 228.0 lb

## 2013-01-15 DIAGNOSIS — R011 Cardiac murmur, unspecified: Secondary | ICD-10-CM

## 2013-01-15 DIAGNOSIS — I10 Essential (primary) hypertension: Secondary | ICD-10-CM

## 2013-01-15 DIAGNOSIS — I4891 Unspecified atrial fibrillation: Secondary | ICD-10-CM

## 2013-01-15 NOTE — Patient Instructions (Signed)
Your physician recommends that you return for a FASTING lipid profile /liver profile.  Your physician recommends that you schedule a follow-up appointment in: 4 months with Dr Shirlee Latch.

## 2013-01-15 NOTE — Progress Notes (Signed)
Patient ID: Kelly Shaffer, female   DOB: Apr 02, 1933, 79 y.o.   MRN: [id number] PCP: Dr. Thedore Mins  79 yo with history of palpitations presents for cardiology followup.  She wore an event monitor in 12/13 with no significant arrhythmia noted.  She continued to have symptoms.  She wore a monitor again in 9/14, this showed 2 runs of irregular tachycardia that looks like atrial fibrillation.  Last echo in 10/13 showed mild focal basal septal hypertrophy.  There was no SAM but there was LV outflow tract turbulence.  No significant gradient measured.  Of note, she had a chest CT in 2/14 that showed coronary calcification.   She has chronic mild DOE after walking 50 yards.  She is short of breath with steps.  No chest pain.   At last appointment, given the frequency of her symptoms, I started her on Multaq to try to hold NSR.  She did not tolerate Multaq due to nausea.  She is now on atenolol 50 mg bid.  She does not feel her heart flutter as much now.  Fluttering will last < 30 seconds if it occurs.  She is in NSR today.   ECG: NSR, 1st degree AV block 234 msec, LAFB, LVH  Past Medical History:  1. Atrial fibrillation: Has had documented PACs.  3 week event monitor in 7/12 with no significant events. 3 week monitor in 12/13 with no significant arrhythmias.  3 week monitor in 9/14 with 2 short runs of atrial fibrillation.  2. Hypertension  3. Osteoarthritis 4. Hysterectomy  5. Cholecystectomy  6. Myoview in 2009 with no ischemia or infarction.  7. Echo (8/11): Focal basal septal hypertrophy, EF 55-60%, no wall motion abnormalities, no significant valvular abnormalities.  Echo (10/13) with EF 60-65%, mild focal basal septal hypertrophy, no SAM, LVOT turbulence with no gradient.   8. Overweight 9. CAD: Coronary calcification on 2/14 chest CT.   History   Social History  . Marital Status: Married    Spouse Name: N/A    Number of Children: N/A  . Years of Education: N/A   Occupational History  . Not on  file.   Social History Main Topics  . Smoking status: Never Smoker   . Smokeless tobacco: Never Used   Comment: tobacco use - no  . Alcohol Use: No  . Drug Use: No  . Sexually Active: Not on file   Other Topics Concern  . Not on file   Social History Narrative   Married, retired, lives [place]   Current Outpatient Prescriptions  Medication Sig Dispense Refill  . apixaban (ELIQUIS) 5 MG TABS tablet Take 1 tablet (5 mg total) by mouth 2 (two) times daily.  60 tablet  2  . atenolol (TENORMIN) 50 MG tablet Take 1 tablet (50 mg total) by mouth 2 (two) times daily.  60 tablet  6  . atorvastatin (LIPITOR) 10 MG tablet Take 1 tablet (10 mg total) by mouth daily.  30 tablet  3  . Glucosamine-Chondroitin (OSTEO BI-FLEX REGULAR STRENGTH) 250-200 MG TABS Take by mouth 2 (two) times daily.        . potassium chloride SA (K-DUR,KLOR-CON) 20 MEQ tablet Take 1 tablet (20 mEq total) by mouth daily.  60 tablet  6  . triamterene-hydrochlorothiazide (MAXZIDE-25) 37.5-25 MG per tablet Take 1 tablet by mouth daily.         No current facility-administered medications for this visit.    BP 122/80  Pulse 51  Ht 5\' 6"  (1.676 m)  Wt 228 lb (103.42 kg)  BMI 36.82 kg/m2 General: NAD, overweight Neck: JVP 7, no thyromegaly or thyroid nodule.  Lungs: Clear to auscultation bilaterally with normal respiratory effort. CV: Nondisplaced PMI.  Heart regular S1/S2, no S3/S4, 1/6 SEM.  Trace ankle edema (wearing compression stockings).  No carotid bruit.  Normal pedal pulses.  Abdomen: Soft, nontender, no hepatosplenomegaly, no distention.  Neurologic: Alert and oriented x 3.  Psych: Normal affect. Extremities: No clubbing or cyanosis.   Assessment/Plan: 1. Atrial fibrillation: Patient does appear to have paroxysmal atrial fibrillation.  She seems to be doing better with less palpitations on atenolol 50 mg bid.  She did not tolerate Multaq.  - Continue apixaban and atenolol. 2. Murmur: Aortic-area systolic  murmur.  I suspect this is due to LVOT turbulence noted on echo.  No significant gradient was measured.  3. CAD: Coronary calcium noted on CT earlier this year.  She is on a statin.  Will check lipids/LFTs.  4. HTN: BP is controlled.   Marca Ancona 01/15/2013

## 2013-02-19 ENCOUNTER — Other Ambulatory Visit (INDEPENDENT_AMBULATORY_CARE_PROVIDER_SITE_OTHER): Payer: 59

## 2013-02-19 DIAGNOSIS — I4891 Unspecified atrial fibrillation: Secondary | ICD-10-CM

## 2013-02-19 LAB — HEPATIC FUNCTION PANEL
ALT: 35 U/L (ref 0–35)
AST: 30 U/L (ref 0–37)
Albumin: 4 g/dL (ref 3.5–5.2)
Alkaline Phosphatase: 82 U/L (ref 39–117)
BILIRUBIN TOTAL: 0.8 mg/dL (ref 0.3–1.2)
Bilirubin, Direct: 0.1 mg/dL (ref 0.0–0.3)
Total Protein: 7 g/dL (ref 6.0–8.3)

## 2013-02-19 LAB — LIPID PANEL
CHOL/HDL RATIO: 3
Cholesterol: 166 mg/dL (ref 0–200)
HDL: 62.8 mg/dL (ref >39.00)
LDL Cholesterol: 86 mg/dL (ref 0–99)
Triglycerides: 86 mg/dL (ref 0.0–149.0)
VLDL: 17.2 mg/dL (ref 0.0–40.0)

## 2013-03-07 ENCOUNTER — Other Ambulatory Visit: Payer: Self-pay

## 2013-03-07 MED ORDER — POTASSIUM CHLORIDE CRYS ER 20 MEQ PO TBCR: 20.0000 meq | EXTENDED_RELEASE_TABLET | Freq: Every day | ORAL | Status: AC

## 2013-03-09 ENCOUNTER — Other Ambulatory Visit: Payer: Self-pay | Admitting: *Deleted

## 2013-03-09 DIAGNOSIS — I4891 Unspecified atrial fibrillation: Secondary | ICD-10-CM

## 2013-03-09 MED ORDER — APIXABAN 5 MG PO TABS: 5.0000 mg | ORAL_TABLET | Freq: Two times a day (BID) | ORAL | Status: DC

## 2013-03-12 ENCOUNTER — Other Ambulatory Visit: Payer: Self-pay | Admitting: Cardiology

## 2013-03-14 ENCOUNTER — Telehealth: Payer: Self-pay

## 2013-03-14 NOTE — Telephone Encounter (Signed)
patient came to the office to get samples of eliquis and asked for multaq samples. When looking at her chart patient was to stopped multaq because of side effects . She states that she is doing fine now with the multaq. I gave her a discount card for eliquis and multaq. We did not have any multaq samples.

## 2013-04-13 ENCOUNTER — Other Ambulatory Visit: Payer: Self-pay | Admitting: *Deleted

## 2013-04-13 ENCOUNTER — Telehealth: Payer: Self-pay | Admitting: *Deleted

## 2013-04-13 DIAGNOSIS — I4891 Unspecified atrial fibrillation: Secondary | ICD-10-CM

## 2013-04-13 MED ORDER — APIXABAN 5 MG PO TABS: 5.0000 mg | ORAL_TABLET | Freq: Two times a day (BID) | ORAL | Status: DC

## 2013-04-13 NOTE — Telephone Encounter (Signed)
Patient requests multaq refill, but I see in Dr Kathlyn SacramentoMcleans note that she did not tolerate it. Please advise if ok to refill for patient. Thanks, MI

## 2013-04-16 MED ORDER — DRONEDARONE HCL 400 MG PO TABS: 400.0000 mg | ORAL_TABLET | Freq: Two times a day (BID) | ORAL | Status: DC

## 2013-04-16 NOTE — Telephone Encounter (Signed)
Pt agreed to increase Multaq to 400mg  bid with food. She has an appt with Dr Shirlee LatchMcLean 04/30/13 and will let him know how she is tolerating this dose at that time.

## 2013-04-16 NOTE — Telephone Encounter (Signed)
LMTCB

## 2013-04-16 NOTE — Telephone Encounter (Signed)
Pt states she did stop Multaq due to nausea and making her feel bad. She decided about 4 weeks ago she would try it again. She has been back on Multaq 400mg  once a day with food. She feels pretty good taking this dose. I will forward to Dr Shirlee LatchMcLean for review and recommendations

## 2013-04-16 NOTE — Telephone Encounter (Signed)
Maybe have her try taking bid with food if she tolerates daily.  May refill.

## 2013-04-18 ENCOUNTER — Other Ambulatory Visit: Payer: Self-pay

## 2013-04-30 ENCOUNTER — Ambulatory Visit (INDEPENDENT_AMBULATORY_CARE_PROVIDER_SITE_OTHER): Payer: 59 | Admitting: Cardiology

## 2013-04-30 ENCOUNTER — Ambulatory Visit: Payer: 59 | Admitting: Cardiology

## 2013-04-30 ENCOUNTER — Encounter: Payer: Self-pay | Admitting: Cardiology

## 2013-04-30 VITALS — BP 122/80 | HR 51 | Ht 66.0 in | Wt 224.0 lb

## 2013-04-30 DIAGNOSIS — R011 Cardiac murmur, unspecified: Secondary | ICD-10-CM

## 2013-04-30 DIAGNOSIS — I4891 Unspecified atrial fibrillation: Secondary | ICD-10-CM

## 2013-04-30 DIAGNOSIS — I10 Essential (primary) hypertension: Secondary | ICD-10-CM

## 2013-04-30 NOTE — Patient Instructions (Signed)
Your physician recommends that you return for lab work today--BMET/CBCd/Liver profile.   Your physician wants you to follow-up in: 6 months with Dr Shirlee LatchMcLean. (September 2015). You will receive a reminder letter in the mail two months in advance. If you don't receive a letter, please call our office to schedule the follow-up appointment.

## 2013-05-01 LAB — CBC WITH DIFFERENTIAL/PLATELET
BASOS PCT: 0.5 % (ref 0.0–3.0)
Basophils Absolute: 0 10*3/uL (ref 0.0–0.1)
Eosinophils Absolute: 0.5 10*3/uL (ref 0.0–0.7)
Eosinophils Relative: 5.5 % — ABNORMAL HIGH (ref 0.0–5.0)
HCT: 35.7 % — ABNORMAL LOW (ref 36.0–46.0)
Hemoglobin: 12 g/dL (ref 12.0–15.0)
LYMPHS ABS: 2.4 10*3/uL (ref 0.7–4.0)
LYMPHS PCT: 28.2 % (ref 12.0–46.0)
MCHC: 33.6 g/dL (ref 30.0–36.0)
MCV: 92.7 fl (ref 78.0–100.0)
MONO ABS: 0.7 10*3/uL (ref 0.1–1.0)
Monocytes Relative: 7.8 % (ref 3.0–12.0)
NEUTROS PCT: 58 % (ref 43.0–77.0)
Neutro Abs: 4.9 10*3/uL (ref 1.4–7.7)
PLATELETS: 254 10*3/uL (ref 150.0–400.0)
RBC: 3.86 Mil/uL — ABNORMAL LOW (ref 3.87–5.11)
RDW: 14.1 % (ref 11.5–14.6)
WBC: 8.4 10*3/uL (ref 4.5–10.5)

## 2013-05-01 LAB — HEPATIC FUNCTION PANEL
ALT: 19 U/L (ref 0–35)
AST: 21 U/L (ref 0–37)
Albumin: 4 g/dL (ref 3.5–5.2)
Alkaline Phosphatase: 75 U/L (ref 39–117)
Bilirubin, Direct: 0 mg/dL (ref 0.0–0.3)
TOTAL PROTEIN: 7 g/dL (ref 6.0–8.3)
Total Bilirubin: 0.6 mg/dL (ref 0.3–1.2)

## 2013-05-01 LAB — BASIC METABOLIC PANEL
BUN: 17 mg/dL (ref 6–23)
CALCIUM: 9.8 mg/dL (ref 8.4–10.5)
CO2: 29 mEq/L (ref 19–32)
CREATININE: 1.1 mg/dL (ref 0.4–1.2)
Chloride: 101 mEq/L (ref 96–112)
GFR: 50.06 mL/min — ABNORMAL LOW (ref >60.00)
GLUCOSE: 110 mg/dL — AB (ref 70–99)
Potassium: 3.6 mEq/L (ref 3.5–5.1)
Sodium: 138 mEq/L (ref 135–145)

## 2013-05-01 NOTE — Progress Notes (Signed)
Patient ID: Kelly Shaffer, female   DOB: 1933/08/31, 80 y.o.   MRN: [id number] PCP: Dr. Thedore MinsSingh  80 yo with history of palpitations presents for cardiology followup.  She wore an event monitor in 12/13 with no significant arrhythmia noted.  She continued to have symptoms.  She wore a monitor again in 9/14, this showed 2 runs of irregular tachycardia that looks like atrial fibrillation.  Last echo in 10/13 showed mild focal basal septal hypertrophy.  There was no SAM but there was LV outflow tract turbulence.  No significant gradient measured.  Of note, she had a chest CT in 2/14 that showed coronary calcification.   She has chronic mild DOE after walking 100 yards.  She is short of breath with steps.  No chest pain.   Initially, I started the patient on Multaq and apixaban.  I used Multaq as she was very symptomatic during atrial fibrillation runs. Initially, she became nauseated on Multaq so she stopped it.  After that, he palpitations got much worse.  Therefore, she restarted Multaq.  She has been making sure to take it with meals.  She has had no nausea and also has had no long runs of tachypalpitations.    ECG: NSR, 1st degree AV block, LAFB, LVH with repolarization abnormality.  Past Medical History:  1. Atrial fibrillation: Has had documented PACs.  3 week event monitor in 7/12 with no significant events. 3 week monitor in 12/13 with no significant arrhythmias.  3 week monitor in 9/14 with 2 short runs of atrial fibrillation.  2. Hypertension  3. Osteoarthritis 4. Hysterectomy  5. Cholecystectomy  6. Myoview in 2009 with no ischemia or infarction.  7. Echo (8/11): Focal basal septal hypertrophy, EF 55-60%, no wall motion abnormalities, no significant valvular abnormalities.  Echo (10/13) with EF 60-65%, mild focal basal septal hypertrophy, no SAM, LVOT turbulence with no gradient.   8. Overweight 9. CAD: Coronary calcification on 2/14 chest CT.   History   Social History  . Marital Status:  Married    Spouse Name: N/A    Number of Children: N/A  . Years of Education: N/A   Occupational History  . Not on file.   Social History Main Topics  . Smoking status: Never Smoker   . Smokeless tobacco: Never Used   Comment: tobacco use - no  . Alcohol Use: No  . Drug Use: No  . Sexually Active: Not on file   Other Topics Concern  . Not on file   Social History Narrative   Married, retired, [place]   Current Outpatient Prescriptions  Medication Sig Dispense Refill  . apixaban (ELIQUIS) 5 MG TABS tablet Take 1 tablet (5 mg total) by mouth 2 (two) times daily.  60 tablet  1  . atenolol (TENORMIN) 50 MG tablet Take 1 tablet (50 mg total) by mouth 2 (two) times daily.  60 tablet  6  . atorvastatin (LIPITOR) 10 MG tablet Take 1 tablet (10 mg total) by mouth daily.  30 tablet  3  . dronedarone (MULTAQ) 400 MG tablet Take 1 tablet (400 mg total) by mouth 2 (two) times daily with a meal.  60 tablet  2  . Glucosamine-Chondroitin (OSTEO BI-FLEX REGULAR STRENGTH) 250-200 MG TABS Take by mouth 2 (two) times daily.        . potassium chloride SA (K-DUR,KLOR-CON) 20 MEQ tablet Take 1 tablet (20 mEq total) by mouth daily.  90 tablet  3  . triamterene-hydrochlorothiazide (MAXZIDE-25) 37.5-25 MG per  tablet Take 1 tablet by mouth daily.         No current facility-administered medications for this visit.    BP 122/80  Pulse 51  Ht 5\' 6"  (1.676 m)  Wt 101.606 kg (224 lb)  BMI 36.17 kg/m2 General: NAD, overweight Neck: JVP 7, no thyromegaly or thyroid nodule.  Lungs: Clear to auscultation bilaterally with normal respiratory effort. CV: Nondisplaced PMI.  Heart regular S1/S2, no S3/S4, 1/6 SEM.  Trace ankle edema (wearing compression stockings).  No carotid bruit.  Normal pedal pulses.  Abdomen: Soft, nontender, no hepatosplenomegaly, no distention.  Neurologic: Alert and oriented x 3.  Psych: Normal affect. Extremities: No clubbing or cyanosis.   Assessment/Plan: 1. Atrial  fibrillation: Patient does appear to have paroxysmal atrial fibrillation.  She seems to be doing better with less palpitations on atenolol 50 mg bid + Multaq.  She had a lot of tachypalpitations off Multaq so I will continue this for now.  She is now tolerating Multaq without problems.  She is not a good class Ic agent candidate due to her degree of LVH (focal basal septal hypertrophy).   - Continue Multaq, apixaban and atenolol. - Check CBC, BMET, and LFTs today.  2. Murmur: Aortic-area systolic murmur.  I suspect this is due to LVOT turbulence noted on echo.  No significant gradient was measured.  3. CAD: Coronary calcium noted on CT earlier this year.  She is on a statin.  Acceptable lipids in 1/15.  4. HTN: BP is controlled.   Marca Ancona 05/01/2013

## 2013-05-02 NOTE — Addendum Note (Signed)
Addended by: Jacqlyn KraussLANKFORD, ANNE M on: 05/02/2013 07:30 AM   Modules accepted: Orders

## 2013-05-10 ENCOUNTER — Ambulatory Visit: Payer: Medicare Other | Admitting: Cardiology

## 2013-05-25 ENCOUNTER — Telehealth: Payer: Self-pay

## 2013-05-25 NOTE — Telephone Encounter (Signed)
Patient called wanting samples of multaq placed samples up front

## 2013-06-06 ENCOUNTER — Telehealth: Payer: Self-pay

## 2013-06-06 NOTE — Telephone Encounter (Signed)
Patient called to get samples of multaq, I also placed assistance program forms with the samples and placed up front

## 2013-06-07 NOTE — Progress Notes (Signed)
Patient ID: [name], female   DOB: 13-Oct-1933, 80 y.o.   MRN: [id number] Filled out patient  Assistance program forms for Multaq and faxed to [employer].

## 2013-06-29 ENCOUNTER — Telehealth: Payer: Self-pay

## 2013-06-29 NOTE — Telephone Encounter (Signed)
Patient called wanting samplesof eiliqus and multaq  Place sample up front

## 2013-07-16 ENCOUNTER — Encounter: Payer: Self-pay | Admitting: Cardiology

## 2013-08-01 ENCOUNTER — Telehealth: Payer: Self-pay | Admitting: *Deleted

## 2013-08-01 NOTE — Telephone Encounter (Signed)
Patient called for multaq and eliquis samples. I will place at the front for pick up.

## 2013-08-31 ENCOUNTER — Telehealth: Payer: Self-pay

## 2013-08-31 NOTE — Telephone Encounter (Signed)
Patient called for samples of eliquis and multaq, placed samples of eliquis up front

## 2013-09-07 ENCOUNTER — Telehealth: Payer: Self-pay | Admitting: Cardiology

## 2013-09-07 NOTE — Telephone Encounter (Signed)
Would be glad to see her this week.

## 2013-09-07 NOTE — Telephone Encounter (Signed)
lmtcb Tried mobile number, connection poor, we were disconnected shortly after patient answered.

## 2013-09-07 NOTE — Telephone Encounter (Signed)
Tried to talk again to patient.  Reception is poor where she is.  I will call her back per her request.

## 2013-09-07 NOTE — Telephone Encounter (Signed)
New message           Pt has questions concerning her medications

## 2013-09-07 NOTE — Telephone Encounter (Signed)
Patient wants to discuss stopping Multaq. 1.) it is expensive and she will be in donut hold very soon and will have to pay for all of her medications when this happens. 2.) has been having headaches and bilat arms/hands are tingling "feels like they fell asleep" and are aching.  Pt would like appointment if possible to discuss this. She will need to get Multaq refilled in a week if she is continuing it. I checked for samples, currently we have none.  Advised that I will forward message to Dr. Shirlee LatchMcLean and his primary nurse for advice.

## 2013-09-10 MED ORDER — DRONEDARONE HCL 400 MG PO TABS: 400.0000 mg | ORAL_TABLET | Freq: Two times a day (BID) | ORAL | Status: DC

## 2013-09-10 NOTE — Telephone Encounter (Signed)
Pt aware of appt with Dr Shirlee LatchMcLean 09/14/13. Pt will continue Multaq until appt with Dr Shirlee LatchMcLean.

## 2013-09-14 ENCOUNTER — Ambulatory Visit (INDEPENDENT_AMBULATORY_CARE_PROVIDER_SITE_OTHER): Payer: 59 | Admitting: Cardiology

## 2013-09-14 ENCOUNTER — Encounter: Payer: Self-pay | Admitting: Cardiology

## 2013-09-14 VITALS — BP 122/66 | HR 50 | Ht 65.0 in | Wt 226.0 lb

## 2013-09-14 DIAGNOSIS — I4891 Unspecified atrial fibrillation: Secondary | ICD-10-CM

## 2013-09-14 DIAGNOSIS — R011 Cardiac murmur, unspecified: Secondary | ICD-10-CM

## 2013-09-14 DIAGNOSIS — I480 Paroxysmal atrial fibrillation: Secondary | ICD-10-CM

## 2013-09-14 DIAGNOSIS — I10 Essential (primary) hypertension: Secondary | ICD-10-CM

## 2013-09-14 LAB — HEPATIC FUNCTION PANEL
ALBUMIN: 3.7 g/dL (ref 3.5–5.2)
ALK PHOS: 88 U/L (ref 39–117)
ALT: 40 U/L — ABNORMAL HIGH (ref 0–35)
AST: 29 U/L (ref 0–37)
BILIRUBIN DIRECT: 0.1 mg/dL (ref 0.0–0.3)
BILIRUBIN TOTAL: 0.6 mg/dL (ref 0.2–1.2)
Total Protein: 6.7 g/dL (ref 6.0–8.3)

## 2013-09-14 MED ORDER — DABIGATRAN ETEXILATE MESYLATE 150 MG PO CAPS: 150.0000 mg | ORAL_CAPSULE | Freq: Two times a day (BID) | ORAL | Status: DC

## 2013-09-14 MED ORDER — APIXABAN 5 MG PO TABS: 5.0000 mg | ORAL_TABLET | Freq: Two times a day (BID) | ORAL | Status: DC

## 2013-09-14 MED ORDER — ATENOLOL 50 MG PO TABS: ORAL_TABLET | ORAL | Status: DC

## 2013-09-14 NOTE — Patient Instructions (Addendum)
Your physician recommends that you have  lab work today--Liver profile.  Decrease atenolol to 50mg  in the AM and 25mg  (1/2 of a 50mg  tablet) PM.  Your physician recommends that you schedule a follow-up appointment in: 4 months with Dr Shirlee LatchMcLean.

## 2013-09-16 NOTE — Progress Notes (Signed)
Patient ID: Kelly Shaffer, female   DOB: [birth year], 80 y.o.   MRN: [id number] PCP: Dr. Thedore Mins  80 yo with history of palpitations presents for cardiology followup.  She wore an event monitor in 12/13 with no significant arrhythmia noted.  She continued to have symptoms.  She wore a monitor again in 9/14, this showed 2 runs of irregular tachycardia that looks like atrial fibrillation.  Last echo in 10/13 showed mild focal basal septal hypertrophy.  There was no SAM but there was LV outflow tract turbulence.  No significant gradient measured.  Of note, she had a chest CT in 2/14 that showed coronary calcification.   She has chronic mild DOE after walking 100 yards.  She is short of breath with steps.  No chest pain.   Initially, I started the patient on Multaq and apixaban.  I used Multaq as she was very symptomatic during atrial fibrillation runs. Initially, she became nauseated on Multaq so she stopped it.  After that, her palpitations got much worse.  Therefore, she restarted Multaq.  She has been making sure to take it with meals.  She has had no nausea and also has had no long runs of tachypalpitations.  Main issue today is that meds are expensive.   ECG: sinus bradycardia at 46 with 1st degree AVB, borderline LVH, QTc 420 msec  Labs (3/15): K 3.6, creatinine 1.1, HCT 35.7  Past Medical History:  1. Atrial fibrillation: Has had documented PACs.  3 week event monitor in 7/12 with no significant events. 3 week monitor in 12/13 with no significant arrhythmias.  3 week monitor in 9/14 with 2 short runs of atrial fibrillation.  2. Hypertension  3. Osteoarthritis 4. Hysterectomy  5. Cholecystectomy  6. Myoview in 2009 with no ischemia or infarction.  7. Echo (8/11): Focal basal septal hypertrophy, EF 55-60%, no wall motion abnormalities, no significant valvular abnormalities.  Echo (10/13) with EF 60-65%, mild focal basal septal hypertrophy, no SAM, LVOT turbulence with no gradient.   8. Overweight 9.  CAD: Coronary calcification on 2/14 chest CT.   History   Social History  . Marital Status: Married    Spouse Name: N/A    Number of Children: N/A  . Years of Education: N/A   Occupational History  . Not on file.   Social History Main Topics  . Smoking status: Never Smoker   . Smokeless tobacco: Never Used   Comment: tobacco use - no  . Alcohol Use: No  . Drug Use: No  . Sexually Active: Not on file   Other Topics Concern  . Not on file   Social History Narrative   Married, retired, [place]   Current Outpatient Prescriptions  Medication Sig Dispense Refill  . atorvastatin (LIPITOR) 10 MG tablet Take 1 tablet (10 mg total) by mouth daily.  30 tablet  3  . dronedarone (MULTAQ) 400 MG tablet Take 1 tablet (400 mg total) by mouth 2 (two) times daily with a meal.  60 tablet  2  . Glucosamine-Chondroitin (OSTEO BI-FLEX REGULAR STRENGTH) 250-200 MG TABS Take by mouth 2 (two) times daily.        . potassium chloride SA (K-DUR,KLOR-CON) 20 MEQ tablet Take 1 tablet (20 mEq total) by mouth daily.  90 tablet  3  . triamterene-hydrochlorothiazide (MAXZIDE-25) 37.5-25 MG per tablet Take 1 tablet by mouth daily.        Marland Kitchen apixaban (ELIQUIS) 5 MG TABS tablet Take 1 tablet (5 mg total) by mouth  2 (two) times daily.  60 tablet  6  . atenolol (TENORMIN) 50 MG tablet 1 tablet in the AM (50mg ) 1/2 tablet (25mg ) in the PM  135 tablet  3   No current facility-administered medications for this visit.    BP 122/66  Pulse 50  Ht 5\' 5"  (1.651 m)  Wt 226 lb (102.513 kg)  BMI 37.61 kg/m2 General: NAD, overweight Neck: JVP 7, no thyromegaly or thyroid nodule.  Lungs: Clear to auscultation bilaterally with normal respiratory effort. CV: Nondisplaced PMI.  Heart regular S1/S2, no S3/S4, 2/6 early SEM.  Trace ankle edema.  No carotid bruit.  Normal pedal pulses.  Abdomen: Soft, nontender, no hepatosplenomegaly, no distention.  Neurologic: Alert and oriented x 3.  Psych: Normal  affect. Extremities: No clubbing or cyanosis.   Assessment/Plan: 1. Atrial fibrillation: Paroxysmal atrial fibrillation.  She seems to be doing better with less palpitations on atenolol + Multaq.  She had a lot of tachypalpitations off Multaq so I would like to continue this for now.  She is now tolerating Multaq without problems.  She is not a good class Ic agent candidate due to her degree of LVH (focal basal septal hypertrophy).   - Continue Multaq, apixaban and atenolol but given bradycardia will decrease atenolol to 50 qam, 25 qpm. - Check LFTs given Multaq use.  - We will try to help her with Eliquis and Multaq samples.  I will see if Xarelto would be any cheaper.  2. Murmur: Aortic-area systolic murmur.  I suspect this is due to LVOT turbulence noted on echo.  No significant gradient was measured.  3. CAD: Coronary calcium noted on CT earlier this year.  She is on a statin.  Acceptable lipids in 1/15.  4. HTN: BP is controlled.   Marca AnconaDalton McLean 09/16/2013

## 2013-09-17 ENCOUNTER — Telehealth: Payer: Self-pay | Admitting: *Deleted

## 2013-09-17 DIAGNOSIS — I480 Paroxysmal atrial fibrillation: Secondary | ICD-10-CM

## 2013-09-17 DIAGNOSIS — R748 Abnormal levels of other serum enzymes: Secondary | ICD-10-CM

## 2013-09-17 NOTE — Telephone Encounter (Signed)
Notes Recorded by Laurey Moralealton S McLean, MD on 09/16/2013 at 11:57 PM Slight increase in ALT, repeat in 1 month.  09/17/13 pt advised, verbalized understanding.

## 2013-09-17 NOTE — Addendum Note (Signed)
Addended by: Jacqlyn KraussLANKFORD, ANNE M on: 09/17/2013 07:48 AM   Modules accepted: Orders

## 2013-09-26 ENCOUNTER — Telehealth: Payer: Self-pay

## 2013-09-26 NOTE — Telephone Encounter (Signed)
Patient called for samples of eliquis. Placed samples up front

## 2013-10-15 ENCOUNTER — Telehealth: Payer: Self-pay | Admitting: *Deleted

## 2013-10-15 NOTE — Telephone Encounter (Signed)
Eliquis samples placed at the front desk for pick up. 

## 2013-10-16 ENCOUNTER — Other Ambulatory Visit (INDEPENDENT_AMBULATORY_CARE_PROVIDER_SITE_OTHER): Payer: 59

## 2013-10-16 ENCOUNTER — Telehealth: Payer: Self-pay | Admitting: *Deleted

## 2013-10-16 DIAGNOSIS — R748 Abnormal levels of other serum enzymes: Secondary | ICD-10-CM

## 2013-10-16 DIAGNOSIS — I480 Paroxysmal atrial fibrillation: Secondary | ICD-10-CM

## 2013-10-16 DIAGNOSIS — I4891 Unspecified atrial fibrillation: Secondary | ICD-10-CM

## 2013-10-16 LAB — HEPATIC FUNCTION PANEL
ALBUMIN: 3.8 g/dL (ref 3.5–5.2)
ALK PHOS: 82 U/L (ref 39–117)
ALT: 25 U/L (ref 0–35)
AST: 24 U/L (ref 0–37)
Bilirubin, Direct: 0.1 mg/dL (ref 0.0–0.3)
TOTAL PROTEIN: 6.9 g/dL (ref 6.0–8.3)
Total Bilirubin: 0.9 mg/dL (ref 0.2–1.2)

## 2013-10-16 MED ORDER — AMIODARONE HCL 200 MG PO TABS: ORAL_TABLET | ORAL | Status: DC

## 2013-10-16 NOTE — Telephone Encounter (Signed)
Pt advised, verbalized understanding, agreed with this plan.

## 2013-10-16 NOTE — Telephone Encounter (Signed)
If she cannot take Multaq, would start her on amiodarone after she has been off Multaq for 3-4 days.  Can use 200 mg bid x 2 wks then 200 mg daily.  Will needs LFTs/TSH in 1 month.

## 2013-10-16 NOTE — Telephone Encounter (Signed)
Pt states she will not be able to continue to afford Multaq. She is in the "donut hole" and is going to be unable to pay for it. Pt makes "a little too much" to qualify for patient assistance program. Kennon Rounds, pharmacist checked and there are no assistance programs offered by the company when patients are in the "donut hole". Pt is going to check with her insurance company again to see if there are any options to help pay for medications while in the "donut hole",  but she said she checked a year or so ago and they did not offer any at that time.  No samples available for pt at this time.   I am forwarding to Dr Shirlee Latch for review

## 2013-10-17 ENCOUNTER — Encounter: Payer: Self-pay | Admitting: Cardiology

## 2013-11-14 ENCOUNTER — Telehealth: Payer: Self-pay | Admitting: *Deleted

## 2013-11-14 NOTE — Telephone Encounter (Signed)
Eliquis samples placed at the front desk for pick up. 

## 2013-11-16 ENCOUNTER — Other Ambulatory Visit: Payer: Self-pay | Admitting: *Deleted

## 2013-11-16 DIAGNOSIS — I480 Paroxysmal atrial fibrillation: Secondary | ICD-10-CM

## 2013-11-16 MED ORDER — AMIODARONE HCL 200 MG PO TABS
200.0000 mg | ORAL_TABLET | Freq: Every day | ORAL | Status: DC
Start: 2013-11-16 — End: 2014-01-02

## 2013-11-20 ENCOUNTER — Encounter: Payer: Self-pay | Admitting: Cardiology

## 2013-11-21 ENCOUNTER — Other Ambulatory Visit: Payer: 59

## 2013-12-21 ENCOUNTER — Ambulatory Visit: Payer: Self-pay | Admitting: Internal Medicine

## 2013-12-25 ENCOUNTER — Telehealth: Payer: Self-pay

## 2013-12-25 NOTE — Telephone Encounter (Signed)
Patient called to get samples of eliquis, placed a month of samples up front

## 2014-01-02 ENCOUNTER — Encounter: Payer: Self-pay | Admitting: Cardiology

## 2014-01-02 ENCOUNTER — Ambulatory Visit (INDEPENDENT_AMBULATORY_CARE_PROVIDER_SITE_OTHER): Payer: 59 | Admitting: Cardiology

## 2014-01-02 VITALS — BP 130/80 | HR 52 | Ht 65.0 in | Wt 227.0 lb

## 2014-01-02 DIAGNOSIS — I2510 Atherosclerotic heart disease of native coronary artery without angina pectoris: Secondary | ICD-10-CM

## 2014-01-02 DIAGNOSIS — I480 Paroxysmal atrial fibrillation: Secondary | ICD-10-CM

## 2014-01-02 DIAGNOSIS — R011 Cardiac murmur, unspecified: Secondary | ICD-10-CM

## 2014-01-02 MED ORDER — AMIODARONE HCL 200 MG PO TABS
ORAL_TABLET | ORAL | Status: DC
Start: 2014-01-02 — End: 2015-03-17

## 2014-01-02 NOTE — Patient Instructions (Signed)
Decrease amiodarone to 100mg  daily. This will be 1/2 of 200mg  tablet daily.  Your physician recommends that you have  lab work today--BMET/CBCd/TSH.  Your physician wants you to follow-up in: 6 months with Dr Shirlee LatchMcLean. (May 2016).  You will receive a reminder letter in the mail two months in advance. If you don't receive a letter, please call our office to schedule the follow-up appointment.

## 2014-01-03 DIAGNOSIS — I2510 Atherosclerotic heart disease of native coronary artery without angina pectoris: Secondary | ICD-10-CM | POA: Insufficient documentation

## 2014-01-03 LAB — CBC WITH DIFFERENTIAL/PLATELET
Basophils Absolute: 0.1 10*3/uL (ref 0.0–0.1)
Basophils Relative: 0.7 % (ref 0.0–3.0)
EOS PCT: 4.7 % (ref 0.0–5.0)
Eosinophils Absolute: 0.4 10*3/uL (ref 0.0–0.7)
HCT: 38.2 % (ref 36.0–46.0)
Hemoglobin: 12.6 g/dL (ref 12.0–15.0)
LYMPHS ABS: 2.2 10*3/uL (ref 0.7–4.0)
Lymphocytes Relative: 26.6 % (ref 12.0–46.0)
MCHC: 33 g/dL (ref 30.0–36.0)
MCV: 94.2 fl (ref 78.0–100.0)
MONO ABS: 0.3 10*3/uL (ref 0.1–1.0)
Monocytes Relative: 3.8 % (ref 3.0–12.0)
NEUTROS PCT: 64.2 % (ref 43.0–77.0)
Neutro Abs: 5.2 10*3/uL (ref 1.4–7.7)
PLATELETS: 243 10*3/uL (ref 150.0–400.0)
RBC: 4.05 Mil/uL (ref 3.87–5.11)
RDW: 13.8 % (ref 11.5–15.5)
WBC: 8.1 10*3/uL (ref 4.0–10.5)

## 2014-01-03 LAB — BASIC METABOLIC PANEL
BUN: 18 mg/dL (ref 6–23)
CHLORIDE: 101 meq/L (ref 96–112)
CO2: 30 meq/L (ref 19–32)
CREATININE: 1 mg/dL (ref 0.4–1.2)
Calcium: 9.6 mg/dL (ref 8.4–10.5)
GFR: 57.62 mL/min — ABNORMAL LOW (ref >60.00)
GLUCOSE: 101 mg/dL — AB (ref 70–99)
POTASSIUM: 4.3 meq/L (ref 3.5–5.1)
Sodium: 138 mEq/L (ref 135–145)

## 2014-01-03 LAB — TSH: TSH: 1.39 u[IU]/mL (ref 0.35–4.50)

## 2014-01-03 NOTE — Progress Notes (Signed)
Patient ID: Kelly Shaffer, female   DOB: [birth year], 80 y.o.   MRN: [id number] PCP: Dr. Leotis ShamesJasmine Singh  80 yo with history of palpitations presents for cardiology followup.  She wore an event monitor in 12/13 with no significant arrhythmia noted.  She continued to have symptoms.  She wore a monitor again in 9/14, this showed 2 runs of irregular tachycardia that looks like atrial fibrillation.  Last echo in 10/13 showed mild focal basal septal hypertrophy.  There was no SAM but there was LV outflow tract turbulence.  No significant gradient measured.  Of note, she had a chest CT in 2/14 that showed coronary calcification.   She has chronic mild DOE after [place].  She is short of breath with steps.  No chest pain.   Initially, I started the patient on Multaq and apixaban.  I used Multaq as she was very symptomatic during atrial fibrillation runs. Initially, she became nauseated on Multaq so she stopped it.  After that, her palpitations got much worse.  She ended up going on amiodarone.  No recent tachypalpitations.    ECG: NSR at 52, iRBBB, left axis deviation  Labs (3/15): K 3.6, creatinine 1.1, HCT 35.7 Labs (10/15): LFTs normal, LDL 79, HDL 67  Past Medical History:  1. Atrial fibrillation: Has had documented PACs.  3 week event monitor in 7/12 with no significant events. 3 week monitor in 12/13 with no significant arrhythmias.  3 week monitor in 9/14 with 2 short runs of atrial fibrillation.  2. Hypertension  3. Osteoarthritis 4. Hysterectomy  5. Cholecystectomy  6. Myoview in 2009 with no ischemia or infarction.  7. Echo (8/11): Focal basal septal hypertrophy, EF 55-60%, no wall motion abnormalities, no significant valvular abnormalities.  Echo (10/13) with EF 60-65%, mild focal basal septal hypertrophy, no SAM, LVOT turbulence with no gradient.   8. Overweight 9. CAD: Coronary calcification on 2/14 chest CT.   History   Social History  . Marital Status: Married    Spouse Name: N/A    Number of Children: N/A  . Years of Education: N/A   Occupational History  . Not on file.   Social History Main Topics  . Smoking status: Never Smoker   . Smokeless tobacco: Never Used   Comment: tobacco use - no  . Alcohol Use: No  . Drug Use: No  . Sexually Active: Not on file   Other Topics Concern  . Not on file   Social History Narrative   Married, retired, [place]   Current Outpatient Prescriptions  Medication Sig Dispense Refill  . apixaban (ELIQUIS) 5 MG TABS tablet Take 1 tablet (5 mg total) by mouth 2 (two) times daily. 60 tablet 6  . atenolol (TENORMIN) 50 MG tablet 1 tablet in the AM (50mg ) 1/2 tablet (25mg ) in the PM 135 tablet 3  . atorvastatin (LIPITOR) 10 MG tablet Take 1 tablet (10 mg total) by mouth daily. 30 tablet 3  . Glucosamine-Chondroitin (OSTEO BI-FLEX REGULAR STRENGTH) 250-200 MG TABS Take by mouth 2 (two) times daily.      . potassium chloride SA (K-DUR,KLOR-CON) 20 MEQ tablet Take 1 tablet (20 mEq total) by mouth daily. 90 tablet 3  . triamterene-hydrochlorothiazide (MAXZIDE-25) 37.5-25 MG per tablet Take 1 tablet by mouth daily.      Marland Kitchen. amiodarone (PACERONE) 200 MG tablet 1/2 tablet (100mg ) daily 15 tablet 6   No current facility-administered medications for this visit.    BP 130/80  mmHg  Pulse 52  Ht 5\' 5"  (1.651 m)  Wt 227 lb (102.967 kg)  BMI 37.77 kg/m2 General: NAD, overweight Neck: JVP 7, no thyromegaly or thyroid nodule.  Lungs: Clear to auscultation bilaterally with normal respiratory effort. CV: Nondisplaced PMI.  Heart regular S1/S2, no S3/S4, 1/6 early SEM.  Trace ankle edema.  No carotid bruit.  Normal pedal pulses.  Abdomen: Soft, nontender, no hepatosplenomegaly, no distention.  Neurologic: Alert and oriented x 3.  Psych: Normal affect. Extremities: No clubbing or cyanosis.   Assessment/Plan: 1. Atrial fibrillation: Paroxysmal atrial fibrillation.  She seems to be doing better with less  palpitations on atenolol + amiodarone.  She was not able to tolerate Multaq and is not a good class Ic candidate with her degree of basal septal hypertrophy.   - Can decrease amiodarone to 100 mg daily.  - Recent LFTs normal, will need to check TSH. She will get yearly eye exams while on amiodarone.   - Continue apixaban and check CBC.  2. Murmur: Aortic-area systolic murmur.  I suspect this is due to LVOT turbulence noted on echo.  No significant gradient was measured.  3. CAD: Coronary calcium noted on CT.  She is on a statin.  Acceptable lipids in 10/15.  4. HTN: BP is controlled.   Marca AnconaDalton Katie Faraone 01/03/2014

## 2014-01-04 NOTE — Addendum Note (Signed)
Addended by: Jacqlyn KraussLANKFORD, Evalee Gerard M on: 01/04/2014 08:13 AM   Modules accepted: Orders

## 2014-02-21 DIAGNOSIS — I10 Essential (primary) hypertension: Secondary | ICD-10-CM | POA: Diagnosis not present

## 2014-02-21 DIAGNOSIS — E782 Mixed hyperlipidemia: Secondary | ICD-10-CM | POA: Insufficient documentation

## 2014-03-14 ENCOUNTER — Telehealth: Payer: Self-pay | Admitting: *Deleted

## 2014-03-14 NOTE — Telephone Encounter (Signed)
Eliquis samples placed at the front desk for patient. 

## 2014-03-25 DIAGNOSIS — H2513 Age-related nuclear cataract, bilateral: Secondary | ICD-10-CM | POA: Diagnosis not present

## 2014-04-05 ENCOUNTER — Telehealth: Payer: Self-pay | Admitting: *Deleted

## 2014-04-05 NOTE — Telephone Encounter (Signed)
Eliquis samples placed at the front desk for patient. 

## 2014-04-19 ENCOUNTER — Telehealth: Payer: Self-pay

## 2014-04-19 NOTE — Telephone Encounter (Signed)
Patient called to get samples of eliquis placed them up front

## 2014-05-20 DIAGNOSIS — I10 Essential (primary) hypertension: Secondary | ICD-10-CM | POA: Diagnosis not present

## 2014-05-20 DIAGNOSIS — E782 Mixed hyperlipidemia: Secondary | ICD-10-CM | POA: Diagnosis not present

## 2014-05-22 ENCOUNTER — Other Ambulatory Visit: Payer: Self-pay

## 2014-05-22 NOTE — Telephone Encounter (Signed)
Patient call for sample of eliquis placed them up front

## 2014-05-27 DIAGNOSIS — Z124 Encounter for screening for malignant neoplasm of cervix: Secondary | ICD-10-CM | POA: Diagnosis not present

## 2014-05-27 DIAGNOSIS — E538 Deficiency of other specified B group vitamins: Secondary | ICD-10-CM | POA: Diagnosis not present

## 2014-05-27 DIAGNOSIS — D649 Anemia, unspecified: Secondary | ICD-10-CM | POA: Insufficient documentation

## 2014-05-27 DIAGNOSIS — Z0000 Encounter for general adult medical examination without abnormal findings: Secondary | ICD-10-CM | POA: Diagnosis not present

## 2014-05-29 ENCOUNTER — Telehealth: Payer: Self-pay

## 2014-05-29 NOTE — Telephone Encounter (Signed)
patient called for samples of eliquis placed up front 

## 2014-06-11 DIAGNOSIS — D649 Anemia, unspecified: Secondary | ICD-10-CM | POA: Diagnosis not present

## 2014-06-27 ENCOUNTER — Telehealth: Payer: Self-pay

## 2014-06-27 NOTE — Telephone Encounter (Signed)
i called [employer] to see if patient could get a tier exceptation for her eliquis

## 2014-06-28 ENCOUNTER — Telehealth: Payer: Self-pay

## 2014-06-28 NOTE — Telephone Encounter (Signed)
[facility] Eliquis 5mg  bid.

## 2014-07-03 ENCOUNTER — Telehealth: Payer: Self-pay

## 2014-07-03 NOTE — Telephone Encounter (Signed)
Patient calls requesting samples of Eliquis 5 mg. 4 boxes of 14 tabs each provided.

## 2014-07-04 ENCOUNTER — Encounter: Payer: Self-pay | Admitting: *Deleted

## 2014-07-04 ENCOUNTER — Encounter: Payer: Self-pay | Admitting: Cardiology

## 2014-07-04 ENCOUNTER — Telehealth: Payer: Self-pay

## 2014-07-04 ENCOUNTER — Ambulatory Visit (INDEPENDENT_AMBULATORY_CARE_PROVIDER_SITE_OTHER): Payer: Commercial Managed Care - HMO | Admitting: Cardiology

## 2014-07-04 VITALS — BP 118/62 | HR 52 | Ht 65.0 in | Wt 230.0 lb

## 2014-07-04 DIAGNOSIS — I422 Other hypertrophic cardiomyopathy: Secondary | ICD-10-CM

## 2014-07-04 DIAGNOSIS — I480 Paroxysmal atrial fibrillation: Secondary | ICD-10-CM | POA: Diagnosis not present

## 2014-07-04 DIAGNOSIS — R011 Cardiac murmur, unspecified: Secondary | ICD-10-CM

## 2014-07-04 NOTE — Telephone Encounter (Signed)
Faxed an appeal [facility]

## 2014-07-04 NOTE — Patient Instructions (Signed)
Medication Instructions:  No changes today.  Labwork: Liver profile/CBCd/TSH in 3 months.  Testing/Procedures: Your physician has requested that you have an echocardiogram. Echocardiography is a painless test that uses sound waves to create images of your heart. It provides your doctor with information about the size and shape of your heart and how well your heart's chambers and valves are working. This procedure takes approximately one hour. There are no restrictions for this procedure.    Follow-Up: Your physician wants you to follow-up in: 6 months with Dr Shirlee LatchMcLean. (November 2016).You will receive a reminder letter in the mail two months in advance. If you don't receive a letter, please call our office to schedule the follow-up appointment.

## 2014-07-06 NOTE — Progress Notes (Signed)
Patient ID: Kelly Shaffer, female   DOB: June 04, 1933, 81 y.o.   MRN: [id number]  PCP: Dr. Leotis Shames  81 yo with history of palpitations presents for cardiology followup.  She wore an event monitor in 12/13 with no significant arrhythmia noted.  She continued to have symptoms.  She wore a monitor again in 9/14, this showed 2 runs of irregular tachycardia that looks like atrial fibrillation.  Last echo in 10/13 showed mild focal basal septal hypertrophy.  There was no SAM but there was LV outflow tract turbulence.  No significant gradient measured.  Of note, she had a chest CT in 2/14 that showed coronary calcification.   She has chronic mild DOE after walking 100 yards to [employer].  She is short of breath with steps.  No changes in this pattern.  No chest pain.    Initially, I started the patient on Multaq and apixaban.  I used Multaq as she was very symptomatic during atrial fibrillation runs. Initially, she became nauseated on Multaq so she stopped it.  After that, her palpitations got much worse.  She ended up going on amiodarone.  No recent tachypalpitations.  No melena/BRBPR with apixaban use.   ECG: NSR, 1st degree AVB, LVH  Labs (3/15): K 3.6, creatinine 1.1, HCT 35.7 Labs (10/15): LFTs normal, LDL 79, HDL 67 Labs (4/16): K 4.1, creatinine 0.8, LFTs normal, TSH normal, LDL 68, HDl 68, HCT 37.5  Past Medical History:  1. Atrial fibrillation: Has had documented PACs.  3 week event monitor in 7/12 with no significant events. 3 week monitor in 12/13 with no significant arrhythmias.  3 week monitor in 9/14 with 2 short runs of atrial fibrillation.  2. Hypertension  3. Osteoarthritis 4. Hysterectomy  5. Cholecystectomy  6. Myoview in 2009 with no ischemia or infarction.  7. Echo (8/11): Focal basal septal hypertrophy, EF 55-60%, no wall motion abnormalities, no significant valvular abnormalities.  Echo (10/13) with EF 60-65%, mild focal basal septal hypertrophy, no SAM, LVOT turbulence with  no gradient.   8. Overweight 9. CAD: Coronary calcification on 2/14 chest CT.   History   Social History  . Marital Status: Married    Spouse Name: N/A    Number of Children: N/A  . Years of Education: N/A   Occupational History  . Not on file.   Social History Main Topics  . Smoking status: Never Smoker   . Smokeless tobacco: Never Used   Comment: tobacco use - no  . Alcohol Use: No  . Drug Use: No  . Sexually Active: Not on file   Other Topics Concern  . Not on file   Social History Narrative   Married, retired, lives [place]   Current Outpatient Prescriptions  Medication Sig Dispense Refill  . amiodarone (PACERONE) 200 MG tablet 1/2 tablet ( ) daily 15 tablet 6  . apixaban (ELIQUIS) 5 MG TABS tablet Take 1 tablet (5 mg total) by mouth 2 (two) times daily. 60 tablet 6  . atenolol (TENORMIN) 50 MG tablet 1 tablet in the AM ( ) 1/2 tablet ( ) in the PM 135 tablet 3  . atorvastatin (LIPITOR) 10 MG tablet Take 1 tablet (10 mg total) by mouth daily. 30 tablet 3  . Glucosamine-Chondroitin (OSTEO BI-FLEX REGULAR STRENGTH) 250-200 MG TABS Take by mouth 2 (two) times daily.      . potassium chloride SA (K-DUR,KLOR-CON) 20 MEQ tablet Take 1 tablet (20 mEq total) by mouth daily. 90 tablet 3  . triamterene-hydrochlorothiazide (MAXZIDE-25)  37.5-25 MG per tablet Take 1 tablet by mouth daily.       No current facility-administered medications for this visit.    BP 118/62 mmHg  Pulse 52  Ht 5\' 5"  (1.651 m)  Wt 230 lb (104.327 kg)  BMI 38.27 kg/m2 General: NAD, overweight Neck: JVP 7, no thyromegaly or thyroid nodule.  Lungs: Clear to auscultation bilaterally with normal respiratory effort. CV: Nondisplaced PMI.  Heart regular S1/S2, no S3/S4, 2/6 early SEM.  Trace ankle edema.  No carotid bruit.  Normal pedal pulses.  Abdomen: Soft, nontender, no hepatosplenomegaly, no distention.  Neurologic: Alert and oriented x 3.  Psych: Normal affect. Extremities: No clubbing  or cyanosis.   Assessment/Plan: 1. Atrial fibrillation: Paroxysmal atrial fibrillation.  She seems to be doing better with less palpitations on atenolol + amiodarone.  She was not able to tolerate Multaq and is not a good class Ic candidate with her degree of basal septal hypertrophy, therefore on amiodarone.   - Continue current amiodarone dosing.  - Recent LFTs and TSH normal, repeat in 3 months.  She will get yearly eye exams while on amiodarone.   - Continue apixaban, recent CBC ok.  2. Murmur: Aortic-area systolic murmur.  I suspect this is due to LVOT turbulence noted on prior echo.  No significant gradient was measured at that time.  Would repeat echo to make sure that gradient/LVOT obstruction is not developing.  3. CAD: Coronary calcium noted on CT.  She is on a statin.  Acceptable lipids in 10/15.  4. HTN: BP is controlled.   Marca AnconaDalton Kenneith Stief 07/06/2014

## 2014-07-10 DIAGNOSIS — J209 Acute bronchitis, unspecified: Secondary | ICD-10-CM | POA: Diagnosis not present

## 2014-07-11 ENCOUNTER — Other Ambulatory Visit (HOSPITAL_COMMUNITY): Payer: Commercial Managed Care - HMO

## 2014-07-18 ENCOUNTER — Ambulatory Visit (HOSPITAL_COMMUNITY): Payer: Commercial Managed Care - HMO | Attending: Cardiovascular Disease

## 2014-07-18 ENCOUNTER — Telehealth: Payer: Self-pay

## 2014-07-18 ENCOUNTER — Other Ambulatory Visit: Payer: Self-pay

## 2014-07-18 DIAGNOSIS — I422 Other hypertrophic cardiomyopathy: Secondary | ICD-10-CM | POA: Diagnosis not present

## 2014-07-18 DIAGNOSIS — I480 Paroxysmal atrial fibrillation: Secondary | ICD-10-CM | POA: Insufficient documentation

## 2014-07-18 NOTE — Telephone Encounter (Signed)
Placed 2 weeks of eliquis up front

## 2014-07-18 NOTE — Telephone Encounter (Signed)
Called [employer] to check on a tier exception for Eliquis it was deined I called [facility] to see how much it was they told me it will be 47 dollars a month I tried to call the patient to let her know this but her phone went out

## 2014-07-19 ENCOUNTER — Telehealth: Payer: Self-pay

## 2014-07-19 NOTE — Telephone Encounter (Signed)
Called to check on an appeal for the tier exception that was sent in on 5/ 19 16. After being on the phone for 40 minutes and transferred 7 times I finally got an answer that it will take 90 days for appeal process

## 2014-08-07 DIAGNOSIS — J45901 Unspecified asthma with (acute) exacerbation: Secondary | ICD-10-CM | POA: Diagnosis not present

## 2014-08-07 DIAGNOSIS — R0989 Other specified symptoms and signs involving the circulatory and respiratory systems: Secondary | ICD-10-CM | POA: Diagnosis not present

## 2014-08-07 DIAGNOSIS — R062 Wheezing: Secondary | ICD-10-CM | POA: Diagnosis not present

## 2014-08-07 DIAGNOSIS — I517 Cardiomegaly: Secondary | ICD-10-CM | POA: Diagnosis not present

## 2014-08-12 ENCOUNTER — Telehealth: Payer: Self-pay

## 2014-08-12 NOTE — Telephone Encounter (Signed)
Refaxed over paper to the [employer]

## 2014-08-15 ENCOUNTER — Other Ambulatory Visit: Payer: Self-pay | Admitting: *Deleted

## 2014-08-15 DIAGNOSIS — I480 Paroxysmal atrial fibrillation: Secondary | ICD-10-CM

## 2014-08-15 MED ORDER — APIXABAN 5 MG PO TABS: 5.0000 mg | ORAL_TABLET | Freq: Two times a day (BID) | ORAL | Status: DC

## 2014-08-30 ENCOUNTER — Telehealth: Payer: Self-pay

## 2014-08-30 NOTE — Telephone Encounter (Signed)
[facility] for Eliquis, good through 02/15/2015.

## 2014-10-04 ENCOUNTER — Other Ambulatory Visit (INDEPENDENT_AMBULATORY_CARE_PROVIDER_SITE_OTHER): Payer: Commercial Managed Care - HMO

## 2014-10-04 DIAGNOSIS — I422 Other hypertrophic cardiomyopathy: Secondary | ICD-10-CM

## 2014-10-04 DIAGNOSIS — I480 Paroxysmal atrial fibrillation: Secondary | ICD-10-CM

## 2014-10-04 LAB — CBC WITH DIFFERENTIAL/PLATELET
Basophils Absolute: 0 10*3/uL (ref 0.0–0.1)
Basophils Relative: 0.5 % (ref 0.0–3.0)
EOS ABS: 0.3 10*3/uL (ref 0.0–0.7)
EOS PCT: 4.6 % (ref 0.0–5.0)
HCT: 36.9 % (ref 36.0–46.0)
Hemoglobin: 12.4 g/dL (ref 12.0–15.0)
LYMPHS ABS: 1.5 10*3/uL (ref 0.7–4.0)
Lymphocytes Relative: 23.3 % (ref 12.0–46.0)
MCHC: 33.6 g/dL (ref 30.0–36.0)
MCV: 94.3 fl (ref 78.0–100.0)
MONO ABS: 0.4 10*3/uL (ref 0.1–1.0)
Monocytes Relative: 7 % (ref 3.0–12.0)
NEUTROS PCT: 64.6 % (ref 43.0–77.0)
Neutro Abs: 4.1 10*3/uL (ref 1.4–7.7)
Platelets: 228 10*3/uL (ref 150.0–400.0)
RBC: 3.91 Mil/uL (ref 3.87–5.11)
RDW: 14.6 % (ref 11.5–15.5)
WBC: 6.4 10*3/uL (ref 4.0–10.5)

## 2014-10-04 LAB — HEPATIC FUNCTION PANEL
ALT: 15 U/L (ref 0–35)
AST: 17 U/L (ref 0–37)
Albumin: 4.1 g/dL (ref 3.5–5.2)
Alkaline Phosphatase: 74 U/L (ref 39–117)
BILIRUBIN TOTAL: 0.8 mg/dL (ref 0.2–1.2)
Bilirubin, Direct: 0.2 mg/dL (ref 0.0–0.3)
Total Protein: 6.7 g/dL (ref 6.0–8.3)

## 2014-10-04 LAB — TSH: TSH: 1.14 u[IU]/mL (ref 0.35–4.50)

## 2014-11-26 ENCOUNTER — Other Ambulatory Visit: Payer: Self-pay | Admitting: Cardiology

## 2014-11-26 DIAGNOSIS — I480 Paroxysmal atrial fibrillation: Secondary | ICD-10-CM

## 2014-11-26 MED ORDER — APIXABAN 5 MG PO TABS: 5.0000 mg | ORAL_TABLET | Freq: Two times a day (BID) | ORAL | Status: DC

## 2014-11-29 DIAGNOSIS — I8393 Asymptomatic varicose veins of bilateral lower extremities: Secondary | ICD-10-CM | POA: Diagnosis not present

## 2014-11-29 DIAGNOSIS — E538 Deficiency of other specified B group vitamins: Secondary | ICD-10-CM | POA: Diagnosis not present

## 2014-11-29 DIAGNOSIS — I10 Essential (primary) hypertension: Secondary | ICD-10-CM | POA: Diagnosis not present

## 2014-11-29 DIAGNOSIS — E782 Mixed hyperlipidemia: Secondary | ICD-10-CM | POA: Diagnosis not present

## 2015-01-06 ENCOUNTER — Telehealth: Payer: Self-pay | Admitting: Cardiology

## 2015-01-06 NOTE — Telephone Encounter (Signed)
Pt c/o medication issue:  1. Name of Medication: Eliquis 5mg   2. How are you currently taking this medication (dosage and times per day)? Twice a day  3. Are you having a reaction (difficulty breathing--STAT)?no  4. What is your medication issue? Pt is having bleeding spots on arm

## 2015-01-06 NOTE — Telephone Encounter (Signed)
Pt wants to let Dr. Shirlee LatchMclean known that for the last 2 to 3 weeks she has been having some bruising on her arms more often now  and sometimes it bleeds. The bleeding  stops when she applies a bandage. Pt is taking Eliquis 5 mg twice a day. Pt has an appointment with Dr. Shirlee LatchMclean on 01/15/15 at 1:15 PM . Pt is aware to keep appointment.

## 2015-01-08 ENCOUNTER — Encounter: Payer: Self-pay | Admitting: *Deleted

## 2015-01-15 ENCOUNTER — Ambulatory Visit: Payer: Commercial Managed Care - HMO | Admitting: Cardiology

## 2015-01-15 ENCOUNTER — Ambulatory Visit (INDEPENDENT_AMBULATORY_CARE_PROVIDER_SITE_OTHER): Payer: Commercial Managed Care - HMO | Admitting: Cardiology

## 2015-01-15 ENCOUNTER — Encounter: Payer: Self-pay | Admitting: Cardiology

## 2015-01-15 VITALS — BP 130/64 | HR 50 | Ht 65.0 in | Wt 232.8 lb

## 2015-01-15 DIAGNOSIS — I4891 Unspecified atrial fibrillation: Secondary | ICD-10-CM | POA: Diagnosis not present

## 2015-01-15 DIAGNOSIS — I480 Paroxysmal atrial fibrillation: Secondary | ICD-10-CM | POA: Diagnosis not present

## 2015-01-15 LAB — COMPREHENSIVE METABOLIC PANEL
ALT: 15 U/L (ref 6–29)
AST: 16 U/L (ref 10–35)
Albumin: 4 g/dL (ref 3.6–5.1)
Alkaline Phosphatase: 75 U/L (ref 33–130)
BILIRUBIN TOTAL: 0.7 mg/dL (ref 0.2–1.2)
BUN: 22 mg/dL (ref 7–25)
CO2: 30 mmol/L (ref 20–31)
Calcium: 9.7 mg/dL (ref 8.6–10.4)
Chloride: 102 mmol/L (ref 98–110)
Creat: 0.89 mg/dL (ref 0.60–0.93)
GLUCOSE: 97 mg/dL (ref 65–99)
Potassium: 3.8 mmol/L (ref 3.5–5.3)
SODIUM: 140 mmol/L (ref 135–146)
Total Protein: 6.3 g/dL (ref 6.1–8.1)

## 2015-01-15 LAB — TSH: TSH: 0.959 u[IU]/mL (ref 0.350–4.500)

## 2015-01-15 NOTE — Telephone Encounter (Signed)
appt 01/15/15 with Dr Shirlee LatchMcLean

## 2015-01-15 NOTE — Patient Instructions (Signed)
Medication Instructions:  No changes today  Labwork: CMET/TSH today  Testing/Procedures: None   Follow-Up: Your physician wants you to follow-up in: 6 months with Dr Shirlee LatchMcLean. (May 2017).You will receive a reminder letter in the mail two months in advance. If you don't receive a letter, please call our office to schedule the follow-up appointment.   Any Other Special Instructions Will Be Listed Below (If Applicable).  Check Mr Grigoryan's blood pressure daily, especially when he is lightheaded. Call the office in about 2 weeks and give us the readings.  Katina Dungnne [address] [phone]     If you need a refill on your cardiac medications before your next appointment, please call your pharmacy.

## 2015-01-16 NOTE — Progress Notes (Signed)
Patient ID: Kelly Shaffer, female   DOB: [birth year], 81 y.o.   MRN: [id number] PCP: Dr. Leotis ShamesJasmine Singh  81 yo with history of palpitations presents for cardiology followup.  She wore an event monitor in 12/13 with no significant arrhythmia noted.  She continued to have symptoms.  She wore a monitor again in 9/14, this showed 2 runs of irregular tachycardia that looks like atrial fibrillation.  Last echo in 10/13 showed mild focal basal septal hypertrophy.  There was no SAM but there was LV outflow tract turbulence.  No significant gradient measured.  Of note, she had a chest CT in 2/14 that showed coronary calcification.   She has chronic mild DOE after [place] [employer].  She is short of breath with steps.  No changes in this pattern.  No chest pain.  No orthopnea/PND.    Initially, I started the patient on Multaq and apixaban.  I used Multaq as she was very symptomatic during atrial fibrillation runs. Initially, she became nauseated on Multaq so she stopped it.  After that, her palpitations got much worse.  She ended up going on amiodarone.  No recent tachypalpitations.  No melena/BRBPR with apixaban use.  Echo in 6/16 showed EF 55-60%.    ECG: NSR, 1st degree AVB, LAFB, lateral Qs  Labs (3/15): K 3.6, creatinine 1.1, HCT 35.7 Labs (10/15): LFTs normal, LDL 79, HDL 67 Labs (4/16): K 4.1, creatinine 0.8, LFTs normal, TSH normal, LDL 68, HDl 68, HCT 37.5 Labs (8/16): LFTs normal, TSH normal, HCT 36.9  Past Medical History:  1. Atrial fibrillation: Has had documented PACs.  3 week event monitor in 7/12 with no significant events. 3 week monitor in 12/13 with no significant arrhythmias.  3 week monitor in 9/14 with 2 short runs of atrial fibrillation.  2. Hypertension  3. Osteoarthritis 4. Hysterectomy  5. Cholecystectomy  6. Myoview in 2009 with no ischemia or infarction.  7. Echo (8/11): Focal basal septal hypertrophy, EF 55-60%, no wall motion abnormalities, no significant valvular  abnormalities.  Echo (10/13) with EF 60-65%, mild focal basal septal hypertrophy, no SAM, LVOT turbulence with no gradient.  Echo (6/16) with EF 55-60%, mild LVH.  8. Overweight 9. CAD: Coronary calcification on 2/14 chest CT.   Social History   Social History  . Marital Status: Married    Spouse Name: N/A  . Number of Children: N/A  . Years of Education: N/A   Social History Main Topics  . Smoking status: Never Smoker   . Smokeless tobacco: Never Used     Comment: tobacco use - no  . Alcohol Use: No  . Drug Use: No  . Sexual Activity: Not Asked   Other Topics Concern  . None   Social History Narrative   Married, retired, Production manageroccassionaly exercises.     Family History  Problem Relation Age of Onset  . Coronary artery disease      family hx  . Diabetes      family hx  . Hypertension      family hx  . Heart failure    . Heart attack Neg Hx   . Stroke Maternal Grandfather   . Stroke Sister   . Hypertension Mother    Current Outpatient Prescriptions  Medication Sig Dispense Refill  . amiodarone (PACERONE) 200 MG tablet 1/2 tablet (100mg ) daily 15 tablet 6  . apixaban (ELIQUIS) 5 MG TABS tablet Take 1 tablet (5 mg total) by mouth 2 (two) times daily. 180  tablet 0  . atenolol (TENORMIN) 50 MG tablet 1 tablet in the AM ( ) 1/2 tablet ( ) in the PM 135 tablet 3  . atorvastatin (LIPITOR) 10 MG tablet Take 1 tablet (10 mg total) by mouth daily. 30 tablet 3  . cyanocobalamin (,VITAMIN B-12,) 1000 MCG/ML injection Inject into the muscle.    . Glucosamine-Chondroitin (OSTEO BI-FLEX REGULAR STRENGTH) 250-200 MG TABS Take by mouth 2 (two) times daily.      . potassium chloride SA (K-DUR,KLOR-CON) 20 MEQ tablet Take 1 tablet (20 mEq total) by mouth daily. 90 tablet 3  . triamterene-hydrochlorothiazide (MAXZIDE-25) 37.5-25 MG per tablet Take 1 tablet by mouth daily.       No current facility-administered medications for this visit.    BP 130/64 mmHg  Pulse 50  Ht   (1.651 m)  Wt 232 lb 12.8 oz (105.597 kg)  BMI 38.74 kg/m2 General: NAD, overweight Neck: JVP 7, no thyromegaly or thyroid nodule.  Lungs: Clear to auscultation bilaterally with normal respiratory effort. CV: Nondisplaced PMI.  Heart regular S1/S2, no S3/S4, 2/6 early SEM.  Trace ankle edema.  No carotid bruit.  Normal pedal pulses.  Abdomen: Soft, nontender, no hepatosplenomegaly, no distention.  Neurologic: Alert and oriented x 3.  Psych: Normal affect. Extremities: No clubbing or cyanosis.   Assessment/Plan: 1. Atrial fibrillation: Paroxysmal atrial fibrillation.  She seems to be doing better with less palpitations on atenolol + amiodarone.  She was not able to tolerate Multaq and was not thought to be a good class Ic candidate with her degree of basal septal hypertrophy, therefore on amiodarone.   - Continue current amiodarone dosing.  - Check LFTs/TSH today.  She will get yearly eye exams while on amiodarone.   - Continue apixaban.  2. Murmur: Aortic-area systolic murmur.  No significant abnormality noted on most recent echo.  3. CAD: Coronary calcium noted on CT.  She is on a statin.    4. HTN: BP is controlled.   Marca Ancona 01/16/2015

## 2015-03-11 ENCOUNTER — Other Ambulatory Visit: Payer: Self-pay | Admitting: *Deleted

## 2015-03-11 ENCOUNTER — Telehealth: Payer: Self-pay | Admitting: Cardiology

## 2015-03-11 DIAGNOSIS — I480 Paroxysmal atrial fibrillation: Secondary | ICD-10-CM

## 2015-03-11 MED ORDER — APIXABAN 5 MG PO TABS: 5.0000 mg | ORAL_TABLET | Freq: Two times a day (BID) | ORAL | Status: DC

## 2015-03-11 NOTE — Telephone Encounter (Signed)
New Message   *STAT* If patient is at the pharmacy, call can be transferred to refill team.   1. Which medications need to be refilled? (please list name of each medication and dose if known) apixaban (ELIQUIS) 5 MG TABS tablet   2. Which pharmacy/location (including street and city if local pharmacy) is medication to be sent to? [facility] in [place]  3. Do they need a 30 day or 90 day supply? 30 day supply   Comments: Pt states that she will need a new script

## 2015-03-17 ENCOUNTER — Other Ambulatory Visit: Payer: Self-pay

## 2015-03-17 DIAGNOSIS — I480 Paroxysmal atrial fibrillation: Secondary | ICD-10-CM

## 2015-03-17 MED ORDER — AMIODARONE HCL 200 MG PO TABS: ORAL_TABLET | ORAL | Status: DC

## 2015-05-15 ENCOUNTER — Telehealth: Payer: Self-pay | Admitting: Cardiology

## 2015-05-15 DIAGNOSIS — I480 Paroxysmal atrial fibrillation: Secondary | ICD-10-CM

## 2015-05-15 NOTE — Telephone Encounter (Signed)
Spoke with pt who is reporting her HR has been in the 50's, she has decreased energy and is having some increased SOB.  She reports this has been occuring since she was here last in 11/16.  Reviewed all medications and she is taking then as listed excepted she is no longer taking the Vit B 12 injections monthly.  She is taking an over the counter Vit B now.  She reports her BP has been borderline "high."  Advised in review of chart, her HR has been in the low 50's for several years now and this does seem to be new for her.  She denies any other s/s.  No wt gain, no increase in edema etc.  She does have an appt with her PCP in approximately 1 week.  Advised to keep that appt.  I will forward this information to Dr Shirlee LatchMcLean for review and further recommendation if any.  Pt states agreement.

## 2015-05-15 NOTE — Telephone Encounter (Signed)
She can decrease atenolol to 25 mg bid to see if this helps her fatigue.

## 2015-05-15 NOTE — Telephone Encounter (Signed)
New Message:    Pt wants to talk to somebody now,she says her heart rate is low>She says it is 50 at this time.

## 2015-05-16 NOTE — Telephone Encounter (Signed)
Pt advised, verbalized understanding, agreed with plan. Pt will call in 7-10 days if symptoms have not improved on lower dose of atenolol.

## 2015-05-21 DIAGNOSIS — E782 Mixed hyperlipidemia: Secondary | ICD-10-CM | POA: Diagnosis not present

## 2015-05-21 DIAGNOSIS — I10 Essential (primary) hypertension: Secondary | ICD-10-CM | POA: Diagnosis not present

## 2015-05-21 DIAGNOSIS — E538 Deficiency of other specified B group vitamins: Secondary | ICD-10-CM | POA: Diagnosis not present

## 2015-07-10 ENCOUNTER — Other Ambulatory Visit: Payer: Self-pay | Admitting: Internal Medicine

## 2015-07-10 DIAGNOSIS — Z23 Encounter for immunization: Secondary | ICD-10-CM | POA: Diagnosis not present

## 2015-07-10 DIAGNOSIS — Z0000 Encounter for general adult medical examination without abnormal findings: Secondary | ICD-10-CM | POA: Diagnosis not present

## 2015-07-10 DIAGNOSIS — Z1239 Encounter for other screening for malignant neoplasm of breast: Secondary | ICD-10-CM

## 2015-07-22 ENCOUNTER — Ambulatory Visit: Payer: Commercial Managed Care - HMO

## 2015-10-13 ENCOUNTER — Other Ambulatory Visit: Payer: Self-pay | Admitting: Cardiology

## 2015-10-13 DIAGNOSIS — I480 Paroxysmal atrial fibrillation: Secondary | ICD-10-CM

## 2015-10-15 DIAGNOSIS — M47816 Spondylosis without myelopathy or radiculopathy, lumbar region: Secondary | ICD-10-CM | POA: Diagnosis not present

## 2015-10-15 DIAGNOSIS — M5442 Lumbago with sciatica, left side: Secondary | ICD-10-CM | POA: Diagnosis not present

## 2015-10-15 DIAGNOSIS — G8929 Other chronic pain: Secondary | ICD-10-CM | POA: Diagnosis not present

## 2015-10-15 DIAGNOSIS — E538 Deficiency of other specified B group vitamins: Secondary | ICD-10-CM | POA: Diagnosis not present

## 2015-10-15 DIAGNOSIS — M25552 Pain in left hip: Secondary | ICD-10-CM | POA: Diagnosis not present

## 2015-10-15 DIAGNOSIS — M5416 Radiculopathy, lumbar region: Secondary | ICD-10-CM | POA: Diagnosis not present

## 2015-10-15 DIAGNOSIS — I10 Essential (primary) hypertension: Secondary | ICD-10-CM | POA: Diagnosis not present

## 2015-10-15 DIAGNOSIS — E782 Mixed hyperlipidemia: Secondary | ICD-10-CM | POA: Diagnosis not present

## 2015-11-06 ENCOUNTER — Encounter: Payer: Self-pay | Admitting: Cardiology

## 2015-11-10 DIAGNOSIS — M461 Sacroiliitis, not elsewhere classified: Secondary | ICD-10-CM | POA: Diagnosis not present

## 2015-11-10 DIAGNOSIS — M5136 Other intervertebral disc degeneration, lumbar region: Secondary | ICD-10-CM | POA: Diagnosis not present

## 2015-11-10 DIAGNOSIS — M47816 Spondylosis without myelopathy or radiculopathy, lumbar region: Secondary | ICD-10-CM | POA: Diagnosis not present

## 2015-11-12 ENCOUNTER — Ambulatory Visit (INDEPENDENT_AMBULATORY_CARE_PROVIDER_SITE_OTHER): Payer: Commercial Managed Care - HMO | Admitting: Cardiology

## 2015-11-12 VITALS — BP 130/80 | HR 53 | Ht 65.0 in | Wt 235.0 lb

## 2015-11-12 DIAGNOSIS — I25118 Atherosclerotic heart disease of native coronary artery with other forms of angina pectoris: Secondary | ICD-10-CM

## 2015-11-12 DIAGNOSIS — R079 Chest pain, unspecified: Secondary | ICD-10-CM

## 2015-11-12 DIAGNOSIS — I480 Paroxysmal atrial fibrillation: Secondary | ICD-10-CM | POA: Diagnosis not present

## 2015-11-12 LAB — COMPREHENSIVE METABOLIC PANEL
ALBUMIN: 4.1 g/dL (ref 3.6–5.1)
ALK PHOS: 64 U/L (ref 33–130)
ALT: 15 U/L (ref 6–29)
AST: 19 U/L (ref 10–35)
BUN: 20 mg/dL (ref 7–25)
CALCIUM: 10 mg/dL (ref 8.6–10.4)
CHLORIDE: 102 mmol/L (ref 98–110)
CO2: 30 mmol/L (ref 20–31)
Creat: 1.3 mg/dL — ABNORMAL HIGH (ref 0.60–0.88)
Glucose, Bld: 79 mg/dL (ref 65–99)
POTASSIUM: 3.9 mmol/L (ref 3.5–5.3)
Sodium: 141 mmol/L (ref 135–146)
TOTAL PROTEIN: 6.4 g/dL (ref 6.1–8.1)
Total Bilirubin: 0.5 mg/dL (ref 0.2–1.2)

## 2015-11-12 LAB — TSH: TSH: 0.98 m[IU]/L

## 2015-11-12 LAB — CBC WITH DIFFERENTIAL/PLATELET
BASOS ABS: 0 {cells}/uL (ref 0–200)
BASOS PCT: 0 %
EOS ABS: 268 {cells}/uL (ref 15–500)
EOS PCT: 4 %
HEMATOCRIT: 37.3 % (ref 35.0–45.0)
HEMOGLOBIN: 12.4 g/dL (ref 11.7–15.5)
LYMPHS PCT: 27 %
Lymphs Abs: 1809 cells/uL (ref 850–3900)
MCH: 32 pg (ref 27.0–33.0)
MCHC: 33.2 g/dL (ref 32.0–36.0)
MCV: 96.1 fL (ref 80.0–100.0)
MONOS PCT: 8 %
MPV: 10 fL (ref 7.5–12.5)
Monocytes Absolute: 536 cells/uL (ref 200–950)
NEUTROS PCT: 61 %
Neutro Abs: 4087 cells/uL (ref 1500–7800)
Platelets: 249 10*3/uL (ref 140–400)
RBC: 3.88 MIL/uL (ref 3.80–5.10)
RDW: 13.9 % (ref 11.0–15.0)
WBC: 6.7 10*3/uL (ref 3.8–10.8)

## 2015-11-12 NOTE — Patient Instructions (Signed)
Medication Instructions:  Your physician recommends that you continue on your current medications as directed. Please refer to the Current Medication list given to you today.   Labwork: CMET/TSH/CBCd today  Testing/Procedures: Your physician has requested that you have a lexiscan myoview. For further information please visit [email or web address]. Please follow instruction sheet, as given.   Follow-Up: Your physician wants you to follow-up in: 6 months with Dr [place]. (March 2018).  You will receive a reminder letter in the mail two months in advance. If you don't receive a letter, please call our office to schedule the follow-up appointment.        If you need a refill on your cardiac medications before your next appointment, please call your pharmacy.

## 2015-11-13 ENCOUNTER — Encounter: Payer: Self-pay | Admitting: Cardiology

## 2015-11-13 ENCOUNTER — Telehealth: Payer: Self-pay | Admitting: Cardiology

## 2015-11-13 NOTE — Telephone Encounter (Signed)
Left courtney a message to call back.

## 2015-11-13 NOTE — Progress Notes (Addendum)
Patient ID: Kelly Shaffer, female   DOB: Mar 07, 1933, 82 y.o.   MRN: [id number] PCP: Dr. Leotis Shaffer  82 yo with history of palpitations presents for cardiology followup.  She wore an event monitor in 12/13 with no significant arrhythmia noted.  She continued to have symptoms.  She wore a monitor again in 9/14, this showed 2 runs of irregular tachycardia that looks like atrial fibrillation.  Last echo in 10/13 showed mild focal basal septal hypertrophy.  There was no SAM but there was LV outflow tract turbulence.  No significant gradient measured.  Of note, she had a chest CT in 2/14 that showed coronary calcification.   She has chronic mild DOE after walking 100 yards to [employer].  She is short of breath with steps.  No changes in this pattern.  No orthopnea/PND.  She has had 2 episodes of central chest tightness recently, episodes were not exertional. One episode was somewhat prolonged and she took her husband's NTG.  Pain resolved with NTG.    For symptomatic atrial fibrillation, I started the patient on Multaq and apixaban.  I used Multaq as she was very symptomatic during atrial fibrillation runs. Initially, she became nauseated on Multaq so she stopped it.  After that, her palpitations got much worse.  She ended up going on amiodarone.  No recent tachypalpitations.  No melena/BRBPR with apixaban use.  Echo in 6/16 showed EF 55-60%.    ECG: NSR, 1st degree AVB, LVH with repolarization abnormality.   Labs (3/15): K 3.6, creatinine 1.1, HCT 35.7 Labs (10/15): LFTs normal, LDL 79, HDL 67 Labs (4/16): K 4.1, creatinine 0.8, LFTs normal, TSH normal, LDL 68, HDl 68, HCT 37.5 Labs (8/16): LFTs normal, TSH normal, HCT 36.9 Labs (11/16): TSH normal, K 3.8, creatinine 0.89, LFTs normal, hgb 12.4  Past Medical History:  1. Atrial fibrillation: Has had documented PACs.  3 week event monitor in 7/12 with no significant events. 3 week monitor in 12/13 with no significant arrhythmias.  3 week monitor in 9/14  with 2 short runs of atrial fibrillation.  2. Hypertension  3. Osteoarthritis 4. Hysterectomy  5. Cholecystectomy  6. Myoview in 2009 with no ischemia or infarction.  7. Echo (8/11): Focal basal septal hypertrophy, EF 55-60%, no wall motion abnormalities, no significant valvular abnormalities.  Echo (10/13) with EF 60-65%, mild focal basal septal hypertrophy, no SAM, LVOT turbulence with no gradient.  Echo (6/16) with EF 55-60%, mild LVH.  8. Overweight 9. CAD: Coronary calcification on 2/14 chest CT.   Social History   Social History  . Marital status: Married    Spouse name: N/A  . Number of children: N/A  . Years of education: N/A   Social History Main Topics  . Smoking status: Never Smoker  . Smokeless tobacco: Never Used     Comment: tobacco use - no  . Alcohol use No  . Drug use: No  . Sexual activity: Not on file   Other Topics Concern  . Not on file   Social History Narrative   Married, retired, Production manager exercises.     Family History  Problem Relation Age of Onset  . Coronary artery disease      family hx  . Diabetes      family hx  . Hypertension      family hx  . Heart failure    . Heart attack Neg Hx   . Stroke Maternal Grandfather   . Stroke Sister   . Hypertension Mother  Current Outpatient Prescriptions  Medication Sig Dispense Refill  . amiodarone (PACERONE) 200 MG tablet Take 1/2 tablet (100mg ) by mouth daily 90 tablet 3  . atenolol (TENORMIN) 50 MG tablet 1/2  tablet in the AM (25mg ) 1/2 tablet (25mg ) in the PM    . atorvastatin (LIPITOR) 10 MG tablet Take 1 tablet (10 mg total) by mouth daily. 30 tablet 3  . cyanocobalamin (,VITAMIN B-12,) 1000 MCG/ML injection Inject into the muscle.    Marland Kitchen. ELIQUIS 5 MG TABS tablet TAKE 1 TABLET(5 MG) BY MOUTH TWICE DAILY 60 tablet 2  . Glucosamine-Chondroitin (OSTEO BI-FLEX REGULAR STRENGTH) 250-200 MG TABS Take by mouth 2 (two) times daily.      . potassium chloride SA (K-DUR,KLOR-CON) 20 MEQ tablet Take  1 tablet (20 mEq total) by mouth daily. 90 tablet 3  . triamterene-hydrochlorothiazide (MAXZIDE-25) 37.5-25 MG per tablet Take 1 tablet by mouth daily.       No current facility-administered medications for this visit.     BP 130/80   Pulse (!) 53   Ht 5\' 5"  (1.651 m)   Wt 235 lb (106.6 kg)   BMI 39.11 kg/m  General: NAD, overweight Neck: JVP 7, no thyromegaly or thyroid nodule.  Lungs: Clear to auscultation bilaterally with normal respiratory effort. CV: Nondisplaced PMI.  Heart regular S1/S2, no S3/S4, 2/6 early SEM.  Trace ankle edema.  No carotid bruit.  Normal pedal pulses.  Abdomen: Soft, nontender, no hepatosplenomegaly, no distention.  Neurologic: Alert and oriented x 3.  Psych: Normal affect. Extremities: No clubbing or cyanosis.   Assessment/Plan: 1. Atrial fibrillation: Paroxysmal atrial fibrillation.  She seems to be doing better with less palpitations on atenolol + amiodarone.  She was not able to tolerate Multaq and was not thought to be a good class Ic candidate with her degree of basal septal hypertrophy, therefore on amiodarone.   - Continue current amiodarone dosing.  - Check LFTs/TSH today.  She will get yearly eye exams while on amiodarone.   - Continue apixaban, get CBC today.  2. Murmur: Aortic-area systolic murmur.  No significant abnormality noted on most recent echo.  3. CAD: Coronary calcium noted on CT.  She is on a statin.  She had 2 episodes of atypical chest pain recently, but one resolved promptly with NTG.   - I will arrange for Lexiscan Cardiolite for risk stratification.  4. HTN: BP is controlled.   Given my transition to CHF clinic in January, I will have her followup with Dr Kelly Shaffer at our [place] office.   Marca AnconaDalton Rodney Shaffer 11/13/2015

## 2015-11-13 NOTE — Telephone Encounter (Signed)
New message    courtney is calling form radiology and she needs Dr.McLean to sign an order in Epic for the pt

## 2015-11-14 NOTE — Telephone Encounter (Signed)
LMTCB for Kelly Shaffer   

## 2015-11-17 DIAGNOSIS — H2513 Age-related nuclear cataract, bilateral: Secondary | ICD-10-CM | POA: Diagnosis not present

## 2015-11-18 NOTE — Telephone Encounter (Signed)
Toni AmendCourtney states Dr Shirlee LatchMcLean has signed order.

## 2015-11-24 ENCOUNTER — Ambulatory Visit: Payer: Commercial Managed Care - HMO

## 2015-11-25 ENCOUNTER — Encounter (HOSPITAL_COMMUNITY): Payer: Commercial Managed Care - HMO

## 2015-11-25 ENCOUNTER — Ambulatory Visit
Admission: RE | Admit: 2015-11-25 | Discharge: 2015-11-25 | Disposition: A | Discharge status: Home / Self Care | Payer: Commercial Managed Care - HMO | Source: Ambulatory Visit | Attending: Cardiology | Admitting: Cardiology

## 2015-11-25 DIAGNOSIS — I25118 Atherosclerotic heart disease of native coronary artery with other forms of angina pectoris: Secondary | ICD-10-CM

## 2015-11-25 DIAGNOSIS — R079 Chest pain, unspecified: Secondary | ICD-10-CM

## 2015-11-25 DIAGNOSIS — R0789 Other chest pain: Secondary | ICD-10-CM

## 2015-11-25 MED ORDER — TECHNETIUM TC 99M TETROFOSMIN IV KIT
13.0000 | PACK | Freq: Once | INTRAVENOUS | Status: AC | PRN
  Administered 2015-11-25: 11.95 via INTRAVENOUS

## 2015-11-25 MED ORDER — TECHNETIUM TC 99M TETROFOSMIN IV KIT
29.1000 | PACK | Freq: Once | INTRAVENOUS | Status: AC | PRN
  Administered 2015-11-25: 29.1 via INTRAVENOUS

## 2015-11-25 MED ORDER — REGADENOSON 0.4 MG/5ML IV SOLN
0.4000 mg | Freq: Once | INTRAVENOUS | Status: AC
  Administered 2015-11-25: 0.4 mg via INTRAVENOUS

## 2015-11-26 LAB — NM MYOCAR MULTI W/SPECT W/WALL MOTION / EF
CHL CUP NUCLEAR SSS: 2
CHL CUP STRESS STAGE 1 GRADE: 0 %
CHL CUP STRESS STAGE 1 HR: 48 {beats}/min
CHL CUP STRESS STAGE 1 SPEED: 0 mph
CHL CUP STRESS STAGE 2 GRADE: 0 %
CHL CUP STRESS STAGE 2 SPEED: 0 mph
CHL CUP STRESS STAGE 3 SPEED: 0 mph
CHL CUP STRESS STAGE 4 GRADE: 0 %
CHL CUP STRESS STAGE 4 HR: 61 {beats}/min
CHL CUP STRESS STAGE 4 SPEED: 0 mph
CHL CUP STRESS STAGE 5 DBP: 79 mmHg
CHL CUP STRESS STAGE 5 HR: 56 {beats}/min
CHL CUP STRESS STAGE 5 SBP: 136 mmHg
CSEPPMHR: 37 %
Estimated workload: 1 METS
LVDIAVOL: 108 mL (ref 46–106)
LVSYSVOL: 34 mL
Peak HR: 53 {beats}/min
Percent HR: 43 %
Rest HR: 47 {beats}/min
SDS: 0
SRS: 3
Stage 2 HR: 48 {beats}/min
Stage 3 Grade: 0 %
Stage 3 HR: 53 {beats}/min
Stage 5 Grade: 0 %
Stage 5 Speed: 0 mph
TID: 1

## 2015-12-04 DIAGNOSIS — M461 Sacroiliitis, not elsewhere classified: Secondary | ICD-10-CM | POA: Diagnosis not present

## 2015-12-11 ENCOUNTER — Other Ambulatory Visit: Payer: Self-pay | Admitting: Internal Medicine

## 2015-12-11 ENCOUNTER — Ambulatory Visit
Admission: RE | Admit: 2015-12-11 | Discharge: 2015-12-11 | Disposition: A | Discharge status: Home / Self Care | Payer: Commercial Managed Care - HMO | Source: Ambulatory Visit | Attending: Internal Medicine | Admitting: Internal Medicine

## 2015-12-11 DIAGNOSIS — Z1239 Encounter for other screening for malignant neoplasm of breast: Secondary | ICD-10-CM

## 2015-12-11 DIAGNOSIS — Z1231 Encounter for screening mammogram for malignant neoplasm of breast: Secondary | ICD-10-CM | POA: Insufficient documentation

## 2015-12-17 ENCOUNTER — Telehealth: Payer: Self-pay | Admitting: Cardiology

## 2015-12-17 NOTE — Telephone Encounter (Signed)
4 boxes of eliquis 5 mg at front desk, pt aware

## 2015-12-17 NOTE — Telephone Encounter (Signed)
New message   Patient calling the office for samples of medication:   1.  What medication and dosage are you requesting samples for?ELIQUIS 5 MG TABS tablet  2.  Are you currently out of this medication? yes   

## 2016-01-12 DIAGNOSIS — M5416 Radiculopathy, lumbar region: Secondary | ICD-10-CM | POA: Diagnosis not present

## 2016-01-12 DIAGNOSIS — E782 Mixed hyperlipidemia: Secondary | ICD-10-CM | POA: Diagnosis not present

## 2016-01-12 DIAGNOSIS — E538 Deficiency of other specified B group vitamins: Secondary | ICD-10-CM | POA: Diagnosis not present

## 2016-01-12 DIAGNOSIS — Z131 Encounter for screening for diabetes mellitus: Secondary | ICD-10-CM | POA: Diagnosis not present

## 2016-01-12 DIAGNOSIS — I10 Essential (primary) hypertension: Secondary | ICD-10-CM | POA: Diagnosis not present

## 2016-01-14 ENCOUNTER — Telehealth: Payer: Self-pay | Admitting: Cardiology

## 2016-01-14 NOTE — Telephone Encounter (Signed)
Pt called requesting samples of Eliquis. I called pt back to inform her that I would be leaving 2 weeks supply of Eliquis 5 mg tablet at the front desk for pt to pick up and if she has any other problems, questions or concerns to call our office. Pt verbalized understanding.    Eliquis 5 mg    Exp: March 2020   LOT# [id number]

## 2016-01-15 DIAGNOSIS — H905 Unspecified sensorineural hearing loss: Secondary | ICD-10-CM | POA: Diagnosis not present

## 2016-01-21 ENCOUNTER — Other Ambulatory Visit: Payer: Self-pay | Admitting: Orthopedic Surgery

## 2016-01-21 DIAGNOSIS — M461 Sacroiliitis, not elsewhere classified: Secondary | ICD-10-CM | POA: Diagnosis not present

## 2016-01-21 DIAGNOSIS — M47816 Spondylosis without myelopathy or radiculopathy, lumbar region: Secondary | ICD-10-CM

## 2016-01-21 DIAGNOSIS — M5136 Other intervertebral disc degeneration, lumbar region: Secondary | ICD-10-CM

## 2016-01-30 ENCOUNTER — Telehealth: Payer: Self-pay | Admitting: *Deleted

## 2016-01-30 NOTE — Telephone Encounter (Signed)
PLACED 2 BOXES OF ELIQUIS AT FRONT DESK.

## 2016-01-31 ENCOUNTER — Ambulatory Visit
Admission: RE | Admit: 2016-01-31 | Discharge: 2016-01-31 | Disposition: A | Discharge status: Home / Self Care | Payer: Commercial Managed Care - HMO | Source: Ambulatory Visit | Attending: Orthopedic Surgery | Admitting: Orthopedic Surgery

## 2016-01-31 DIAGNOSIS — M8938 Hypertrophy of bone, other site: Secondary | ICD-10-CM | POA: Diagnosis not present

## 2016-01-31 DIAGNOSIS — M47816 Spondylosis without myelopathy or radiculopathy, lumbar region: Secondary | ICD-10-CM

## 2016-01-31 DIAGNOSIS — M5127 Other intervertebral disc displacement, lumbosacral region: Secondary | ICD-10-CM | POA: Diagnosis not present

## 2016-01-31 DIAGNOSIS — M1288 Other specific arthropathies, not elsewhere classified, other specified site: Secondary | ICD-10-CM | POA: Insufficient documentation

## 2016-01-31 DIAGNOSIS — M461 Sacroiliitis, not elsewhere classified: Secondary | ICD-10-CM | POA: Diagnosis not present

## 2016-01-31 DIAGNOSIS — M5126 Other intervertebral disc displacement, lumbar region: Secondary | ICD-10-CM | POA: Diagnosis not present

## 2016-01-31 DIAGNOSIS — M5136 Other intervertebral disc degeneration, lumbar region: Secondary | ICD-10-CM | POA: Diagnosis not present

## 2016-01-31 DIAGNOSIS — R600 Localized edema: Secondary | ICD-10-CM | POA: Diagnosis not present

## 2016-02-04 ENCOUNTER — Ambulatory Visit: Payer: Commercial Managed Care - HMO | Admitting: Cardiology

## 2016-02-10 ENCOUNTER — Telehealth: Payer: Self-pay | Admitting: Cardiology

## 2016-02-10 NOTE — Telephone Encounter (Signed)
New message      Pt c/o medication issue:  1. Name of Medication: eliquis 2. How are you currently taking this medication (dosage and times per day)? 5mg  daily 3. Are you having a reaction (difficulty breathing--STAT)? no 4. What is your medication issue? Pt was having nosebleeds daily. She cut back on her eliquis and is now taking one 5mg  pill daily.  Since this change, she has not had any nosebleeds.  She has been on 1 pill daily for about 1 week.  Is it ok to continue taking 1 pill daily instead of 2 pills?

## 2016-02-10 NOTE — Telephone Encounter (Signed)
I spoke with pt. She reports nose bleeds which started about 2-3 weeks ago. About one week ago she decreased Eliquis to once daily. Nose bleeds stopped. She reports nose bleeds usually happened every day when she was having them. Sometimes at random times and other times when she blew her nose. Usually left nostril. She compresses nostril and bleeding stops.  States it is not a lot of blood and doesn't last long.  She reports she had frequent nose bleeds when she was younger.  I told pt she should resume Eliquis twice daily and explained need for twice daily dosing to her.  I told her to let us know if bleeding starts again.  Will forward to Dr. Mariah MillingGollan for review/recommendations.

## 2016-02-11 NOTE — Telephone Encounter (Signed)
Per Dr. Mariah MillingGollan, [facility] as pt has not been seen in [place] office.

## 2016-02-12 NOTE — Telephone Encounter (Signed)
Resume Eliquis twice daily but would use saline nasal spray twice a day every day to keep moist.

## 2016-02-12 NOTE — Telephone Encounter (Signed)
I spoke with pt and gave her information from Dr. McLean 

## 2016-03-11 DIAGNOSIS — M47816 Spondylosis without myelopathy or radiculopathy, lumbar region: Secondary | ICD-10-CM | POA: Diagnosis not present

## 2016-03-23 ENCOUNTER — Other Ambulatory Visit: Payer: Self-pay | Admitting: Cardiology

## 2016-03-23 DIAGNOSIS — I480 Paroxysmal atrial fibrillation: Secondary | ICD-10-CM

## 2016-04-07 DIAGNOSIS — M47816 Spondylosis without myelopathy or radiculopathy, lumbar region: Secondary | ICD-10-CM | POA: Diagnosis not present

## 2016-04-07 DIAGNOSIS — M4690 Unspecified inflammatory spondylopathy, site unspecified: Secondary | ICD-10-CM | POA: Diagnosis not present

## 2016-04-07 DIAGNOSIS — M5136 Other intervertebral disc degeneration, lumbar region: Secondary | ICD-10-CM | POA: Diagnosis not present

## 2016-04-14 DIAGNOSIS — I491 Atrial premature depolarization: Secondary | ICD-10-CM | POA: Diagnosis not present

## 2016-04-14 DIAGNOSIS — Z7901 Long term (current) use of anticoagulants: Secondary | ICD-10-CM | POA: Diagnosis not present

## 2016-04-14 DIAGNOSIS — M5136 Other intervertebral disc degeneration, lumbar region: Secondary | ICD-10-CM | POA: Diagnosis not present

## 2016-04-14 DIAGNOSIS — M47816 Spondylosis without myelopathy or radiculopathy, lumbar region: Secondary | ICD-10-CM | POA: Diagnosis not present

## 2016-04-14 DIAGNOSIS — M4690 Unspecified inflammatory spondylopathy, site unspecified: Secondary | ICD-10-CM | POA: Diagnosis not present

## 2016-04-14 DIAGNOSIS — I10 Essential (primary) hypertension: Secondary | ICD-10-CM | POA: Diagnosis not present

## 2016-04-19 DIAGNOSIS — I491 Atrial premature depolarization: Secondary | ICD-10-CM | POA: Diagnosis not present

## 2016-04-19 DIAGNOSIS — M47816 Spondylosis without myelopathy or radiculopathy, lumbar region: Secondary | ICD-10-CM | POA: Diagnosis not present

## 2016-04-19 DIAGNOSIS — M5136 Other intervertebral disc degeneration, lumbar region: Secondary | ICD-10-CM | POA: Diagnosis not present

## 2016-04-19 DIAGNOSIS — I10 Essential (primary) hypertension: Secondary | ICD-10-CM | POA: Diagnosis not present

## 2016-04-19 DIAGNOSIS — Z7901 Long term (current) use of anticoagulants: Secondary | ICD-10-CM | POA: Diagnosis not present

## 2016-04-19 DIAGNOSIS — M4690 Unspecified inflammatory spondylopathy, site unspecified: Secondary | ICD-10-CM | POA: Diagnosis not present

## 2016-04-21 DIAGNOSIS — I491 Atrial premature depolarization: Secondary | ICD-10-CM | POA: Diagnosis not present

## 2016-04-21 DIAGNOSIS — I10 Essential (primary) hypertension: Secondary | ICD-10-CM | POA: Diagnosis not present

## 2016-04-21 DIAGNOSIS — M4690 Unspecified inflammatory spondylopathy, site unspecified: Secondary | ICD-10-CM | POA: Diagnosis not present

## 2016-04-21 DIAGNOSIS — Z7901 Long term (current) use of anticoagulants: Secondary | ICD-10-CM | POA: Diagnosis not present

## 2016-04-21 DIAGNOSIS — M5136 Other intervertebral disc degeneration, lumbar region: Secondary | ICD-10-CM | POA: Diagnosis not present

## 2016-04-21 DIAGNOSIS — M47816 Spondylosis without myelopathy or radiculopathy, lumbar region: Secondary | ICD-10-CM | POA: Diagnosis not present

## 2016-04-28 DIAGNOSIS — I10 Essential (primary) hypertension: Secondary | ICD-10-CM | POA: Diagnosis not present

## 2016-04-28 DIAGNOSIS — Z7901 Long term (current) use of anticoagulants: Secondary | ICD-10-CM | POA: Diagnosis not present

## 2016-04-28 DIAGNOSIS — M4690 Unspecified inflammatory spondylopathy, site unspecified: Secondary | ICD-10-CM | POA: Diagnosis not present

## 2016-04-28 DIAGNOSIS — M5136 Other intervertebral disc degeneration, lumbar region: Secondary | ICD-10-CM | POA: Diagnosis not present

## 2016-04-28 DIAGNOSIS — I491 Atrial premature depolarization: Secondary | ICD-10-CM | POA: Diagnosis not present

## 2016-04-28 DIAGNOSIS — M47816 Spondylosis without myelopathy or radiculopathy, lumbar region: Secondary | ICD-10-CM | POA: Diagnosis not present

## 2016-04-30 DIAGNOSIS — I10 Essential (primary) hypertension: Secondary | ICD-10-CM | POA: Diagnosis not present

## 2016-04-30 DIAGNOSIS — M4690 Unspecified inflammatory spondylopathy, site unspecified: Secondary | ICD-10-CM | POA: Diagnosis not present

## 2016-04-30 DIAGNOSIS — M47816 Spondylosis without myelopathy or radiculopathy, lumbar region: Secondary | ICD-10-CM | POA: Diagnosis not present

## 2016-04-30 DIAGNOSIS — I491 Atrial premature depolarization: Secondary | ICD-10-CM | POA: Diagnosis not present

## 2016-04-30 DIAGNOSIS — M5136 Other intervertebral disc degeneration, lumbar region: Secondary | ICD-10-CM | POA: Diagnosis not present

## 2016-04-30 DIAGNOSIS — Z7901 Long term (current) use of anticoagulants: Secondary | ICD-10-CM | POA: Diagnosis not present

## 2016-05-09 NOTE — Progress Notes (Signed)
Cardiology Office Note  Date:  05/10/2016   ID:  Kelly Shaffer, DOB 24-Oct-1933, MRN [id number]  PCP:  Leotis Shames, MD   Chief Complaint  Patient presents with  . other    Former Dr. Shirlee Latch patient for A-Fib. Would like to establish care in [place]. Meds reviewed by the pt. verbally. "doing well."      HPI:  83 yo with hx of  Paroxysmal atrial fibrillation, on amiodarone CAD on CT scan HTN Obesity  She wore an event monitor in 12/13 with no significant arrhythmia noted. She presents today for follow-up of her paroxysmal atrial fibrillation And establish care in the [facility] office  Repeat monitor worn again in 9/14, this showed 2 runs of irregular tachycardia that looks like atrial fibrillation.    Other studies reviewed with her in detail today echo in 10/13 showed mild focal basal septal hypertrophy.    chest CT in 2/14 that showed coronary calcification.     Left hip pain,  Worse getting up in the night Had cortisone, "epidural"  Previously reported havingchronic mild DOE after walking 100 yards to the mailbox.   She is sedentary, no regular exercise program Limited by left hip pain  Previously reported having 2 episodes of central chest tightness recently, episodes were not exertional. One episode was somewhat prolonged and she took her husband's NTG.  Pain resolved with NTG.   On today's visit denies having significant chest pain  Echo in 6/16 showed EF 55-60%.   Personally reviewed by myself  ECG: NSR, 1st degree AVB, rate 49 bpm, LVH with repolarization abnormality.  Rate is slower compared to previous EKGs   Past Medical History:  1. Atrial fibrillation: Has had documented PACs.  3 week event monitor in 7/12 with no significant events. 3 week monitor in 12/13 with no significant arrhythmias.  3 week monitor in 9/14 with 2 short runs of atrial fibrillation.  2. Hypertension  3. Osteoarthritis  4. Hysterectomy  5. Cholecystectomy  6. Myoview in 2009  with no ischemia or infarction.  7. Echo (8/11): Focal basal septal hypertrophy, EF 55-60%, no wall motion abnormalities, no significant valvular abnormalities.  Echo (10/13) with EF 60-65%, mild focal basal septal hypertrophy, no SAM, LVOT turbulence with no gradient.  Echo (6/16) with EF 55-60%, mild LVH.  8. Overweight 9. CAD: Coronary calcification on 2/14 chest CT.   PMH:   has a past medical history of Arthritis; HTN (hypertension); Overweight(278.02); PAC (premature atrial contraction); and Palpitations.  PSH:    Past Surgical History:  Procedure Laterality Date  . CHOLECYSTECTOMY    . focal basal hypertrophy     EF 55-60%. echo 09/19/09  . TOTAL ABDOMINAL HYSTERECTOMY      Current Outpatient Prescriptions  Medication Sig Dispense Refill  . amiodarone (PACERONE) 200 MG tablet Take 1/2 tablet (100mg ) by mouth daily 90 tablet 3  . atenolol (TENORMIN) 50 MG tablet 1/2  tablet in the AM (25mg ) 1/2 tablet (25mg ) in the PM    . atorvastatin (LIPITOR) 10 MG tablet Take 1 tablet (10 mg total) by mouth daily. 30 tablet 3  . cyanocobalamin (,VITAMIN B-12,) 1000 MCG/ML injection Inject 1,000 mcg into the muscle once.     Marland Kitchen ELIQUIS 5 MG TABS tablet take 1 tablet by mouth twice a day 60 tablet 5  . Glucosamine-Chondroitin (OSTEO BI-FLEX REGULAR STRENGTH) 250-200 MG TABS Take by mouth 2 (two) times daily.      . potassium chloride SA (K-DUR,KLOR-CON) 20 MEQ tablet Take 1  tablet (20 mEq total) by mouth daily. 90 tablet 3  . triamterene-hydrochlorothiazide (MAXZIDE-25) 37.5-25 MG per tablet Take 1 tablet by mouth daily.       No current facility-administered medications for this visit.      Allergies:   Multaq [dronedarone]; Mupirocin; and Zyrtec [cetirizine]   Social History:  The patient  reports that she has never smoked. She has never used smokeless tobacco. She reports that she does not drink alcohol or use drugs.   Family History:   family history includes Breast cancer in her maternal  aunt; Hypertension in her mother; Stroke in her maternal grandfather and sister.    Review of Systems: Review of Systems  Constitutional: Negative.   Respiratory: Negative.   Cardiovascular: Negative.   Gastrointestinal: Negative.   Musculoskeletal: Positive for joint pain.  Neurological: Negative.        Tingling left fingers  Psychiatric/Behavioral: Negative.   All other systems reviewed and are negative.    PHYSICAL EXAM: VS:  BP 136/72 (BP Location: Left Arm, Patient Position: Sitting, Cuff Size: Large)   Pulse (!) 55   Ht 5\' 5"  (1.651 m)   Wt 226 lb 12 oz (102.9 kg)   BMI 37.73 kg/m  , BMI Body mass index is 37.73 kg/m. GEN: Well nourished, well developed, in no acute distress , obese HEENT: normal  Neck: no JVD, carotid bruits, or masses Cardiac: RR, bradycardia,  no murmurs, rubs, or gallops,no edema , nonpitting Respiratory:  clear to auscultation bilaterally, normal work of breathing GI: soft, nontender, nondistended, + BS MS: no deformity or atrophy  Skin: warm and dry, no rash Neuro:  Strength and sensation are intact Psych: euthymic mood, full affect    Recent Labs: 11/12/2015: ALT 15; BUN 20; Creat 1.30; Hemoglobin 12.4; Platelets 249; Potassium 3.9; Sodium 141; TSH 0.98    Lipid Panel Lab Results  Component Value Date   CHOL 166 02/19/2013   HDL 62.80 02/19/2013   LDLCALC 86 02/19/2013   TRIG 86.0 02/19/2013      Wt Readings from Last 3 Encounters:  05/10/16 226 lb 12 oz (102.9 kg)  11/12/15 235 lb (106.6 kg)  01/15/15 232 lb 12.8 oz (105.6 kg)       ASSESSMENT AND PLAN:  Essential hypertension - Plan: EKG 12-Lead Decrease atenolol down to 25 mg daily Monitor heart rate  Coronary artery disease involving native coronary artery of native heart with other form of angina pectoris (HCC) - Plan: EKG 12-Lead Currently with no symptoms of angina. No further workup at this time. Continue current medication regimen.  Paroxysmal atrial  fibrillation (HCC) - Plan: EKG 12-Lead Rhythm is NSR Decrease atenolol as above  Mixed hyperlipidemia - Plan: EKG 12-Lead Cholesterol is at goal on the current lipid regimen. No changes to the medications were made.  Osteoarthritis Left hip   Total encounter time more than 25 minutes  Greater than 50% was spent in counseling and coordination of care with the patient   Disposition:   F/U  6 months   Orders Placed This Encounter  Procedures  . EKG 12-Lead     Signed, Dossie Arbourim Sherrika Weakland, M.D., Ph.D. 05/10/2016  [facility], [place] [phone]

## 2016-05-10 ENCOUNTER — Ambulatory Visit (INDEPENDENT_AMBULATORY_CARE_PROVIDER_SITE_OTHER): Payer: Medicare HMO | Admitting: Cardiovascular Disease

## 2016-05-10 ENCOUNTER — Encounter: Payer: Self-pay | Admitting: Cardiovascular Disease

## 2016-05-10 VITALS — BP 136/72 | HR 55 | Ht 65.0 in | Wt 226.8 lb

## 2016-05-10 DIAGNOSIS — E782 Mixed hyperlipidemia: Secondary | ICD-10-CM | POA: Diagnosis not present

## 2016-05-10 DIAGNOSIS — I25118 Atherosclerotic heart disease of native coronary artery with other forms of angina pectoris: Secondary | ICD-10-CM | POA: Diagnosis not present

## 2016-05-10 DIAGNOSIS — I10 Essential (primary) hypertension: Secondary | ICD-10-CM

## 2016-05-10 DIAGNOSIS — I480 Paroxysmal atrial fibrillation: Secondary | ICD-10-CM

## 2016-05-10 DIAGNOSIS — M1990 Unspecified osteoarthritis, unspecified site: Secondary | ICD-10-CM

## 2016-05-10 MED ORDER — ATENOLOL 25 MG PO TABS: 25.0000 mg | ORAL_TABLET | Freq: Every day | ORAL | 3 refills | Status: DC

## 2016-05-10 NOTE — Patient Instructions (Addendum)
Medication Instructions:   Please decrease the atenolol down to 25 mg daily Please monitor your heart rate  Labwork:  No new labs needed  Testing/Procedures:  No further testing at this time   I recommend watching educational videos on topics of interest to you at:       [email or web address]  Enter code: [email or web address]    Follow-Up: It was a pleasure seeing you in the office today. Please call us if you have new issues that need to be addressed before your next appt.  [phone]  Your physician wants you to follow-up in: 6 months.  You will receive a reminder letter in the mail two months in advance. If you don't receive a letter, please call our office to schedule the follow-up appointment.  If you need a refill on your cardiac medications before your next appointment, please call your pharmacy.

## 2016-06-10 ENCOUNTER — Other Ambulatory Visit: Payer: Self-pay | Admitting: Cardiology

## 2016-06-10 DIAGNOSIS — I480 Paroxysmal atrial fibrillation: Secondary | ICD-10-CM

## 2016-06-23 ENCOUNTER — Encounter: Payer: Self-pay | Admitting: Cardiovascular Disease

## 2016-07-16 DIAGNOSIS — L03112 Cellulitis of left axilla: Secondary | ICD-10-CM | POA: Diagnosis not present

## 2016-07-16 DIAGNOSIS — B359 Dermatophytosis, unspecified: Secondary | ICD-10-CM | POA: Diagnosis not present

## 2016-07-16 DIAGNOSIS — L02412 Cutaneous abscess of left axilla: Secondary | ICD-10-CM | POA: Diagnosis not present

## 2016-07-19 DIAGNOSIS — L02412 Cutaneous abscess of left axilla: Secondary | ICD-10-CM | POA: Diagnosis not present

## 2016-07-19 DIAGNOSIS — L03112 Cellulitis of left axilla: Secondary | ICD-10-CM | POA: Diagnosis not present

## 2016-07-20 ENCOUNTER — Other Ambulatory Visit: Payer: Self-pay

## 2016-07-20 ENCOUNTER — Telehealth: Payer: Self-pay | Admitting: Cardiovascular Disease

## 2016-07-20 MED ORDER — ATENOLOL 25 MG PO TABS: 25.0000 mg | ORAL_TABLET | Freq: Every day | ORAL | 3 refills | Status: AC

## 2016-07-20 NOTE — Telephone Encounter (Signed)
°*  STAT* If patient is at the pharmacy, call can be transferred to refill team.   1. Which medications need to be refilled? (please list name of each medication and dose if known)  Atenolol 25 mg 2 x a day   2. Which pharmacy/location (including street and city if local pharmacy) is medication to be sent to? [facility] in [place]   3. Do they need a 30 day or 90 day supply? 90day

## 2016-07-20 NOTE — Telephone Encounter (Signed)
Per pt's last ov:  Medication Instructions:   Please decrease the atenolol down to 25 mg daily Please monitor your heart rate  Updated rx sent in.

## 2016-07-20 NOTE — Telephone Encounter (Signed)
Pt requesting atenolol 25 mg tablet BID refill. Pt has on medication list qd. Please advise if ok to refill medication.

## 2016-07-27 DIAGNOSIS — L304 Erythema intertrigo: Secondary | ICD-10-CM | POA: Diagnosis not present

## 2016-07-27 DIAGNOSIS — M5416 Radiculopathy, lumbar region: Secondary | ICD-10-CM | POA: Diagnosis not present

## 2016-07-27 DIAGNOSIS — E7800 Pure hypercholesterolemia, unspecified: Secondary | ICD-10-CM | POA: Diagnosis not present

## 2016-07-27 DIAGNOSIS — I10 Essential (primary) hypertension: Secondary | ICD-10-CM | POA: Diagnosis not present

## 2016-07-27 DIAGNOSIS — L03112 Cellulitis of left axilla: Secondary | ICD-10-CM | POA: Diagnosis not present

## 2016-08-23 DIAGNOSIS — M5442 Lumbago with sciatica, left side: Secondary | ICD-10-CM | POA: Diagnosis not present

## 2016-08-23 DIAGNOSIS — M5136 Other intervertebral disc degeneration, lumbar region: Secondary | ICD-10-CM | POA: Diagnosis not present

## 2016-08-23 DIAGNOSIS — M9903 Segmental and somatic dysfunction of lumbar region: Secondary | ICD-10-CM | POA: Diagnosis not present

## 2016-08-23 DIAGNOSIS — M5137 Other intervertebral disc degeneration, lumbosacral region: Secondary | ICD-10-CM | POA: Diagnosis not present

## 2016-08-23 DIAGNOSIS — M9904 Segmental and somatic dysfunction of sacral region: Secondary | ICD-10-CM | POA: Diagnosis not present

## 2016-08-25 DIAGNOSIS — M5136 Other intervertebral disc degeneration, lumbar region: Secondary | ICD-10-CM | POA: Diagnosis not present

## 2016-08-25 DIAGNOSIS — M9903 Segmental and somatic dysfunction of lumbar region: Secondary | ICD-10-CM | POA: Diagnosis not present

## 2016-08-25 DIAGNOSIS — M5442 Lumbago with sciatica, left side: Secondary | ICD-10-CM | POA: Diagnosis not present

## 2016-08-25 DIAGNOSIS — M9904 Segmental and somatic dysfunction of sacral region: Secondary | ICD-10-CM | POA: Diagnosis not present

## 2016-08-25 DIAGNOSIS — M5137 Other intervertebral disc degeneration, lumbosacral region: Secondary | ICD-10-CM | POA: Diagnosis not present

## 2016-08-26 DIAGNOSIS — R69 Illness, unspecified: Secondary | ICD-10-CM | POA: Diagnosis not present

## 2016-08-26 DIAGNOSIS — M9903 Segmental and somatic dysfunction of lumbar region: Secondary | ICD-10-CM | POA: Diagnosis not present

## 2016-08-26 DIAGNOSIS — M5136 Other intervertebral disc degeneration, lumbar region: Secondary | ICD-10-CM | POA: Diagnosis not present

## 2016-08-26 DIAGNOSIS — M5442 Lumbago with sciatica, left side: Secondary | ICD-10-CM | POA: Diagnosis not present

## 2016-08-26 DIAGNOSIS — M9904 Segmental and somatic dysfunction of sacral region: Secondary | ICD-10-CM | POA: Diagnosis not present

## 2016-08-26 DIAGNOSIS — M5137 Other intervertebral disc degeneration, lumbosacral region: Secondary | ICD-10-CM | POA: Diagnosis not present

## 2016-08-30 DIAGNOSIS — M9904 Segmental and somatic dysfunction of sacral region: Secondary | ICD-10-CM | POA: Diagnosis not present

## 2016-08-30 DIAGNOSIS — M5136 Other intervertebral disc degeneration, lumbar region: Secondary | ICD-10-CM | POA: Diagnosis not present

## 2016-08-30 DIAGNOSIS — M5442 Lumbago with sciatica, left side: Secondary | ICD-10-CM | POA: Diagnosis not present

## 2016-08-30 DIAGNOSIS — M9903 Segmental and somatic dysfunction of lumbar region: Secondary | ICD-10-CM | POA: Diagnosis not present

## 2016-08-30 DIAGNOSIS — M5137 Other intervertebral disc degeneration, lumbosacral region: Secondary | ICD-10-CM | POA: Diagnosis not present

## 2016-08-31 DIAGNOSIS — Z131 Encounter for screening for diabetes mellitus: Secondary | ICD-10-CM | POA: Diagnosis not present

## 2016-08-31 DIAGNOSIS — E538 Deficiency of other specified B group vitamins: Secondary | ICD-10-CM | POA: Diagnosis not present

## 2016-08-31 DIAGNOSIS — I10 Essential (primary) hypertension: Secondary | ICD-10-CM | POA: Diagnosis not present

## 2016-09-01 DIAGNOSIS — M5137 Other intervertebral disc degeneration, lumbosacral region: Secondary | ICD-10-CM | POA: Diagnosis not present

## 2016-09-01 DIAGNOSIS — M5136 Other intervertebral disc degeneration, lumbar region: Secondary | ICD-10-CM | POA: Diagnosis not present

## 2016-09-01 DIAGNOSIS — M9903 Segmental and somatic dysfunction of lumbar region: Secondary | ICD-10-CM | POA: Diagnosis not present

## 2016-09-01 DIAGNOSIS — M9904 Segmental and somatic dysfunction of sacral region: Secondary | ICD-10-CM | POA: Diagnosis not present

## 2016-09-01 DIAGNOSIS — M5442 Lumbago with sciatica, left side: Secondary | ICD-10-CM | POA: Diagnosis not present

## 2016-09-02 DIAGNOSIS — M5442 Lumbago with sciatica, left side: Secondary | ICD-10-CM | POA: Diagnosis not present

## 2016-09-02 DIAGNOSIS — M9904 Segmental and somatic dysfunction of sacral region: Secondary | ICD-10-CM | POA: Diagnosis not present

## 2016-09-02 DIAGNOSIS — M9903 Segmental and somatic dysfunction of lumbar region: Secondary | ICD-10-CM | POA: Diagnosis not present

## 2016-09-02 DIAGNOSIS — M5137 Other intervertebral disc degeneration, lumbosacral region: Secondary | ICD-10-CM | POA: Diagnosis not present

## 2016-09-02 DIAGNOSIS — M5136 Other intervertebral disc degeneration, lumbar region: Secondary | ICD-10-CM | POA: Diagnosis not present

## 2016-09-06 DIAGNOSIS — M5136 Other intervertebral disc degeneration, lumbar region: Secondary | ICD-10-CM | POA: Diagnosis not present

## 2016-09-06 DIAGNOSIS — M9904 Segmental and somatic dysfunction of sacral region: Secondary | ICD-10-CM | POA: Diagnosis not present

## 2016-09-06 DIAGNOSIS — M5137 Other intervertebral disc degeneration, lumbosacral region: Secondary | ICD-10-CM | POA: Diagnosis not present

## 2016-09-06 DIAGNOSIS — M5442 Lumbago with sciatica, left side: Secondary | ICD-10-CM | POA: Diagnosis not present

## 2016-09-06 DIAGNOSIS — M9903 Segmental and somatic dysfunction of lumbar region: Secondary | ICD-10-CM | POA: Diagnosis not present

## 2016-09-07 DIAGNOSIS — R809 Proteinuria, unspecified: Secondary | ICD-10-CM | POA: Diagnosis not present

## 2016-09-07 DIAGNOSIS — Z1231 Encounter for screening mammogram for malignant neoplasm of breast: Secondary | ICD-10-CM | POA: Diagnosis not present

## 2016-09-07 DIAGNOSIS — I10 Essential (primary) hypertension: Secondary | ICD-10-CM | POA: Diagnosis not present

## 2016-09-07 DIAGNOSIS — Z0000 Encounter for general adult medical examination without abnormal findings: Secondary | ICD-10-CM | POA: Diagnosis not present

## 2016-09-08 DIAGNOSIS — M5136 Other intervertebral disc degeneration, lumbar region: Secondary | ICD-10-CM | POA: Diagnosis not present

## 2016-09-08 DIAGNOSIS — M5442 Lumbago with sciatica, left side: Secondary | ICD-10-CM | POA: Diagnosis not present

## 2016-09-08 DIAGNOSIS — M5137 Other intervertebral disc degeneration, lumbosacral region: Secondary | ICD-10-CM | POA: Diagnosis not present

## 2016-09-08 DIAGNOSIS — M9903 Segmental and somatic dysfunction of lumbar region: Secondary | ICD-10-CM | POA: Diagnosis not present

## 2016-09-08 DIAGNOSIS — M9904 Segmental and somatic dysfunction of sacral region: Secondary | ICD-10-CM | POA: Diagnosis not present

## 2016-09-09 DIAGNOSIS — M5136 Other intervertebral disc degeneration, lumbar region: Secondary | ICD-10-CM | POA: Diagnosis not present

## 2016-09-09 DIAGNOSIS — M9904 Segmental and somatic dysfunction of sacral region: Secondary | ICD-10-CM | POA: Diagnosis not present

## 2016-09-09 DIAGNOSIS — M5137 Other intervertebral disc degeneration, lumbosacral region: Secondary | ICD-10-CM | POA: Diagnosis not present

## 2016-09-09 DIAGNOSIS — M5442 Lumbago with sciatica, left side: Secondary | ICD-10-CM | POA: Diagnosis not present

## 2016-09-09 DIAGNOSIS — M9903 Segmental and somatic dysfunction of lumbar region: Secondary | ICD-10-CM | POA: Diagnosis not present

## 2016-09-13 DIAGNOSIS — M9903 Segmental and somatic dysfunction of lumbar region: Secondary | ICD-10-CM | POA: Diagnosis not present

## 2016-09-13 DIAGNOSIS — M5137 Other intervertebral disc degeneration, lumbosacral region: Secondary | ICD-10-CM | POA: Diagnosis not present

## 2016-09-13 DIAGNOSIS — M5442 Lumbago with sciatica, left side: Secondary | ICD-10-CM | POA: Diagnosis not present

## 2016-09-13 DIAGNOSIS — M5136 Other intervertebral disc degeneration, lumbar region: Secondary | ICD-10-CM | POA: Diagnosis not present

## 2016-09-13 DIAGNOSIS — M9904 Segmental and somatic dysfunction of sacral region: Secondary | ICD-10-CM | POA: Diagnosis not present

## 2016-09-15 DIAGNOSIS — M9903 Segmental and somatic dysfunction of lumbar region: Secondary | ICD-10-CM | POA: Diagnosis not present

## 2016-09-15 DIAGNOSIS — M5442 Lumbago with sciatica, left side: Secondary | ICD-10-CM | POA: Diagnosis not present

## 2016-09-15 DIAGNOSIS — M5137 Other intervertebral disc degeneration, lumbosacral region: Secondary | ICD-10-CM | POA: Diagnosis not present

## 2016-09-15 DIAGNOSIS — M9904 Segmental and somatic dysfunction of sacral region: Secondary | ICD-10-CM | POA: Diagnosis not present

## 2016-09-15 DIAGNOSIS — M5136 Other intervertebral disc degeneration, lumbar region: Secondary | ICD-10-CM | POA: Diagnosis not present

## 2016-09-16 DIAGNOSIS — M5136 Other intervertebral disc degeneration, lumbar region: Secondary | ICD-10-CM | POA: Diagnosis not present

## 2016-09-16 DIAGNOSIS — M9904 Segmental and somatic dysfunction of sacral region: Secondary | ICD-10-CM | POA: Diagnosis not present

## 2016-09-16 DIAGNOSIS — M5137 Other intervertebral disc degeneration, lumbosacral region: Secondary | ICD-10-CM | POA: Diagnosis not present

## 2016-09-16 DIAGNOSIS — M5442 Lumbago with sciatica, left side: Secondary | ICD-10-CM | POA: Diagnosis not present

## 2016-09-16 DIAGNOSIS — M9903 Segmental and somatic dysfunction of lumbar region: Secondary | ICD-10-CM | POA: Diagnosis not present

## 2016-09-23 ENCOUNTER — Other Ambulatory Visit: Payer: Self-pay | Admitting: Cardiovascular Disease

## 2016-09-23 DIAGNOSIS — I480 Paroxysmal atrial fibrillation: Secondary | ICD-10-CM

## 2016-11-01 ENCOUNTER — Other Ambulatory Visit: Payer: Self-pay | Admitting: Internal Medicine

## 2016-11-01 DIAGNOSIS — Z1231 Encounter for screening mammogram for malignant neoplasm of breast: Secondary | ICD-10-CM

## 2016-12-01 DIAGNOSIS — I480 Paroxysmal atrial fibrillation: Secondary | ICD-10-CM | POA: Diagnosis not present

## 2016-12-01 DIAGNOSIS — I2510 Atherosclerotic heart disease of native coronary artery without angina pectoris: Secondary | ICD-10-CM | POA: Diagnosis not present

## 2016-12-01 DIAGNOSIS — Z7901 Long term (current) use of anticoagulants: Secondary | ICD-10-CM | POA: Diagnosis not present

## 2016-12-01 DIAGNOSIS — R0609 Other forms of dyspnea: Secondary | ICD-10-CM | POA: Diagnosis not present

## 2016-12-01 DIAGNOSIS — Z79899 Other long term (current) drug therapy: Secondary | ICD-10-CM | POA: Diagnosis not present

## 2016-12-01 DIAGNOSIS — I10 Essential (primary) hypertension: Secondary | ICD-10-CM | POA: Diagnosis not present

## 2016-12-08 DIAGNOSIS — M4727 Other spondylosis with radiculopathy, lumbosacral region: Secondary | ICD-10-CM | POA: Diagnosis not present

## 2016-12-08 DIAGNOSIS — M5416 Radiculopathy, lumbar region: Secondary | ICD-10-CM | POA: Diagnosis not present

## 2016-12-13 ENCOUNTER — Ambulatory Visit
Admission: RE | Admit: 2016-12-13 | Discharge: 2016-12-13 | Disposition: A | Discharge status: Home / Self Care | Payer: Medicare HMO | Source: Ambulatory Visit | Attending: Internal Medicine | Admitting: Internal Medicine

## 2016-12-13 DIAGNOSIS — Z1231 Encounter for screening mammogram for malignant neoplasm of breast: Secondary | ICD-10-CM | POA: Diagnosis not present

## 2016-12-22 DIAGNOSIS — H2513 Age-related nuclear cataract, bilateral: Secondary | ICD-10-CM | POA: Diagnosis not present

## 2017-01-12 DIAGNOSIS — I480 Paroxysmal atrial fibrillation: Secondary | ICD-10-CM | POA: Diagnosis not present

## 2017-01-12 DIAGNOSIS — R0609 Other forms of dyspnea: Secondary | ICD-10-CM | POA: Diagnosis not present

## 2017-01-28 ENCOUNTER — Other Ambulatory Visit: Payer: Self-pay | Admitting: *Deleted

## 2017-01-28 DIAGNOSIS — I480 Paroxysmal atrial fibrillation: Secondary | ICD-10-CM

## 2017-01-28 MED ORDER — APIXABAN 5 MG PO TABS: 5.0000 mg | ORAL_TABLET | Freq: Two times a day (BID) | ORAL | 3 refills | Status: DC

## 2017-02-18 DIAGNOSIS — M461 Sacroiliitis, not elsewhere classified: Secondary | ICD-10-CM | POA: Diagnosis not present

## 2017-02-18 DIAGNOSIS — M5416 Radiculopathy, lumbar region: Secondary | ICD-10-CM | POA: Diagnosis not present

## 2017-02-18 DIAGNOSIS — M47816 Spondylosis without myelopathy or radiculopathy, lumbar region: Secondary | ICD-10-CM | POA: Diagnosis not present

## 2017-02-18 DIAGNOSIS — M5136 Other intervertebral disc degeneration, lumbar region: Secondary | ICD-10-CM | POA: Diagnosis not present

## 2017-03-10 DIAGNOSIS — M5136 Other intervertebral disc degeneration, lumbar region: Secondary | ICD-10-CM | POA: Diagnosis not present

## 2017-03-10 DIAGNOSIS — M5416 Radiculopathy, lumbar region: Secondary | ICD-10-CM | POA: Diagnosis not present

## 2017-03-11 DIAGNOSIS — I10 Essential (primary) hypertension: Secondary | ICD-10-CM | POA: Diagnosis not present

## 2017-03-11 DIAGNOSIS — R7309 Other abnormal glucose: Secondary | ICD-10-CM | POA: Diagnosis not present

## 2017-03-30 DIAGNOSIS — I2510 Atherosclerotic heart disease of native coronary artery without angina pectoris: Secondary | ICD-10-CM | POA: Diagnosis not present

## 2017-03-30 DIAGNOSIS — Z7901 Long term (current) use of anticoagulants: Secondary | ICD-10-CM | POA: Diagnosis not present

## 2017-03-30 DIAGNOSIS — Z79899 Other long term (current) drug therapy: Secondary | ICD-10-CM | POA: Diagnosis not present

## 2017-03-30 DIAGNOSIS — I10 Essential (primary) hypertension: Secondary | ICD-10-CM | POA: Diagnosis not present

## 2017-03-30 DIAGNOSIS — I480 Paroxysmal atrial fibrillation: Secondary | ICD-10-CM | POA: Diagnosis not present

## 2017-04-05 DIAGNOSIS — R7989 Other specified abnormal findings of blood chemistry: Secondary | ICD-10-CM | POA: Diagnosis not present

## 2017-04-05 DIAGNOSIS — L03115 Cellulitis of right lower limb: Secondary | ICD-10-CM | POA: Diagnosis not present

## 2017-04-05 DIAGNOSIS — E782 Mixed hyperlipidemia: Secondary | ICD-10-CM | POA: Diagnosis not present

## 2017-04-05 DIAGNOSIS — R7309 Other abnormal glucose: Secondary | ICD-10-CM | POA: Diagnosis not present

## 2017-04-07 DIAGNOSIS — E782 Mixed hyperlipidemia: Secondary | ICD-10-CM | POA: Diagnosis not present

## 2017-04-08 DIAGNOSIS — L03115 Cellulitis of right lower limb: Secondary | ICD-10-CM | POA: Diagnosis not present

## 2017-04-08 DIAGNOSIS — N183 Chronic kidney disease, stage 3 (moderate): Secondary | ICD-10-CM | POA: Diagnosis not present

## 2017-04-11 DIAGNOSIS — N183 Chronic kidney disease, stage 3 (moderate): Secondary | ICD-10-CM | POA: Diagnosis not present

## 2017-04-11 DIAGNOSIS — L03115 Cellulitis of right lower limb: Secondary | ICD-10-CM | POA: Diagnosis not present

## 2017-04-14 DIAGNOSIS — L03115 Cellulitis of right lower limb: Secondary | ICD-10-CM | POA: Diagnosis not present

## 2017-04-20 DIAGNOSIS — L03115 Cellulitis of right lower limb: Secondary | ICD-10-CM | POA: Diagnosis not present

## 2017-04-25 IMAGING — MG MM DIGITAL SCREENING BILAT W/ TOMO W/ CAD
8 of 14 series · 8 of 30 positions shown · non-contrast
Comparison: Previous exam(s).

CLINICAL DATA: Screening.

EXAM:
2D DIGITAL SCREENING BILATERAL MAMMOGRAM WITH CAD AND ADJUNCT TOMO

[L MLO (1 of 2)]
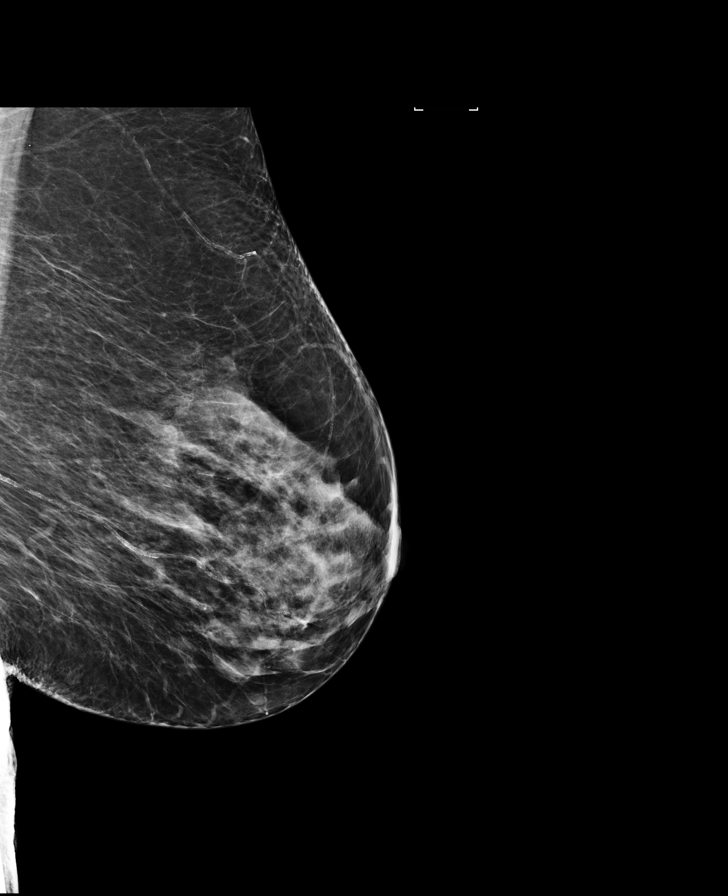

[R MLO (1 of 2)]
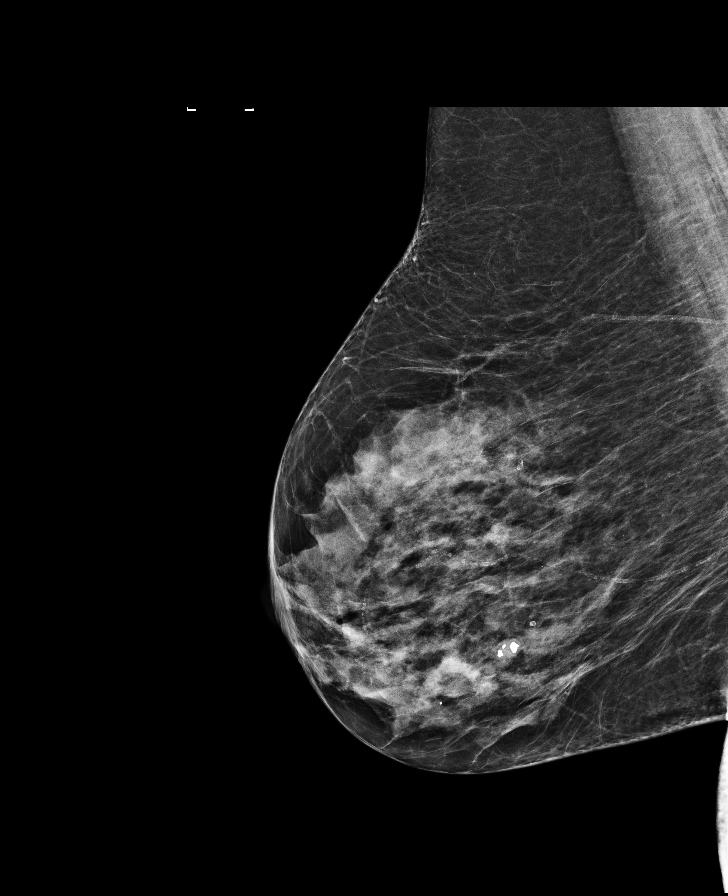

[R CC]
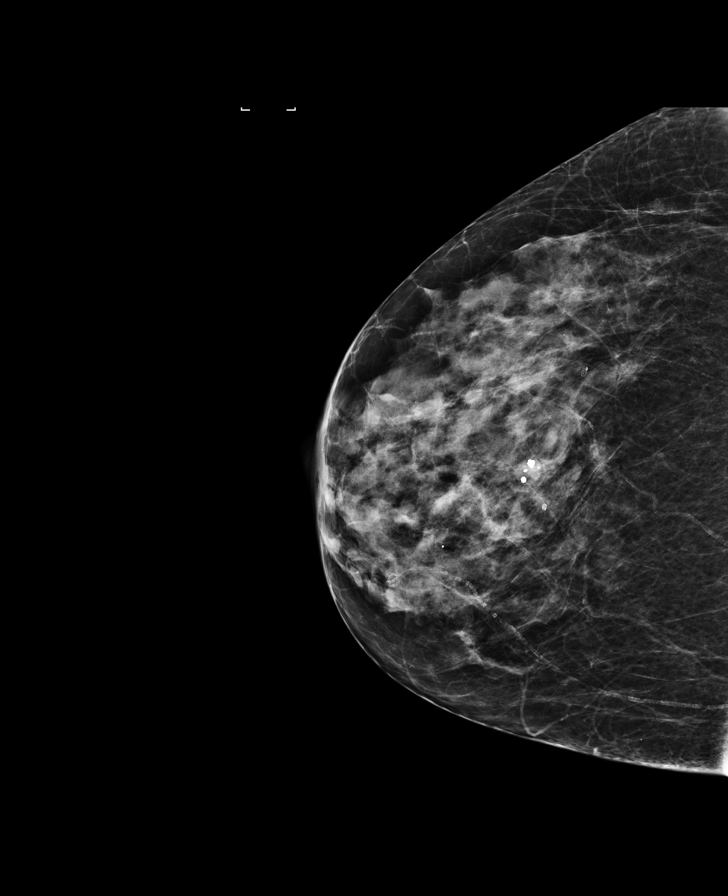

[R MLO (2 of 2)]
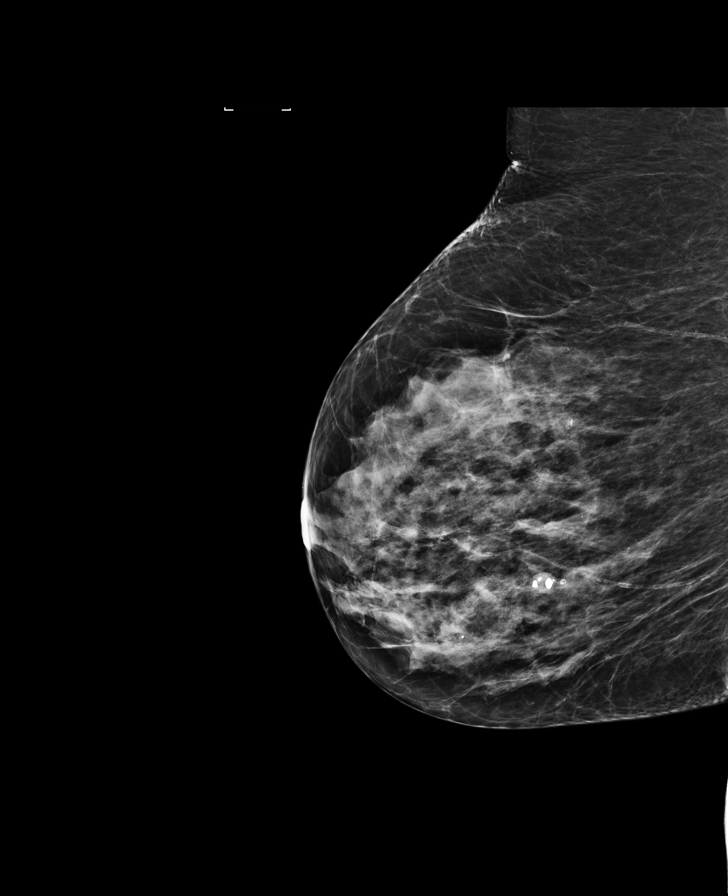

[L MLO synth-2D]
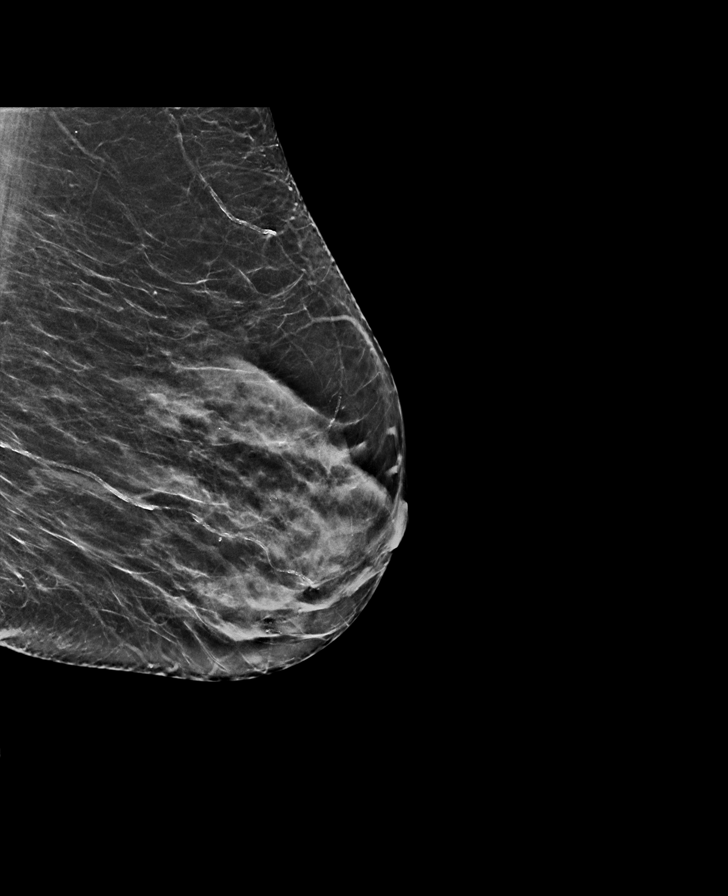

[R CC synth-2D]
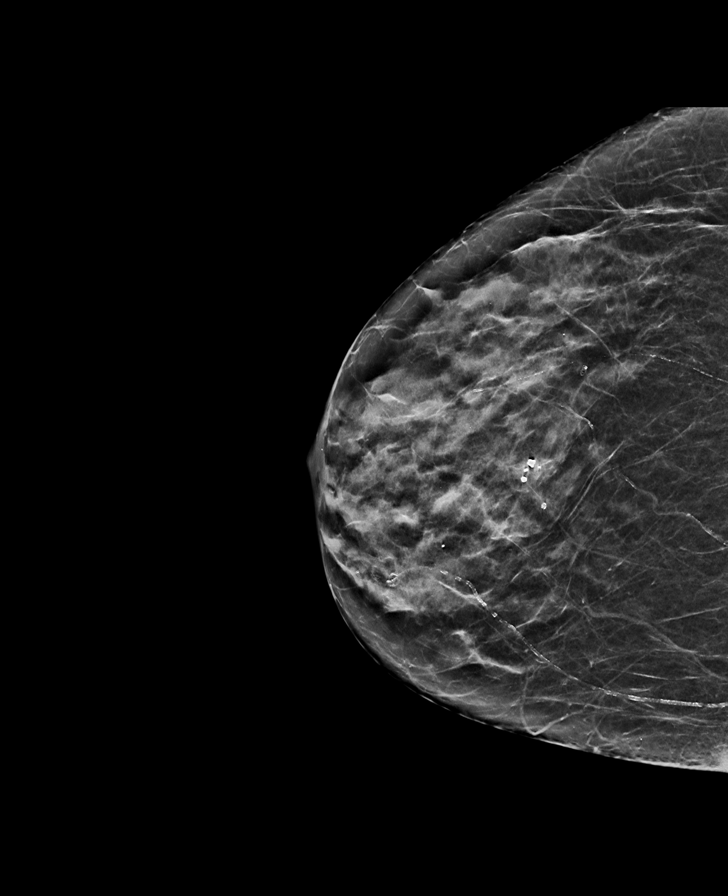

[R MLO synth-2D]
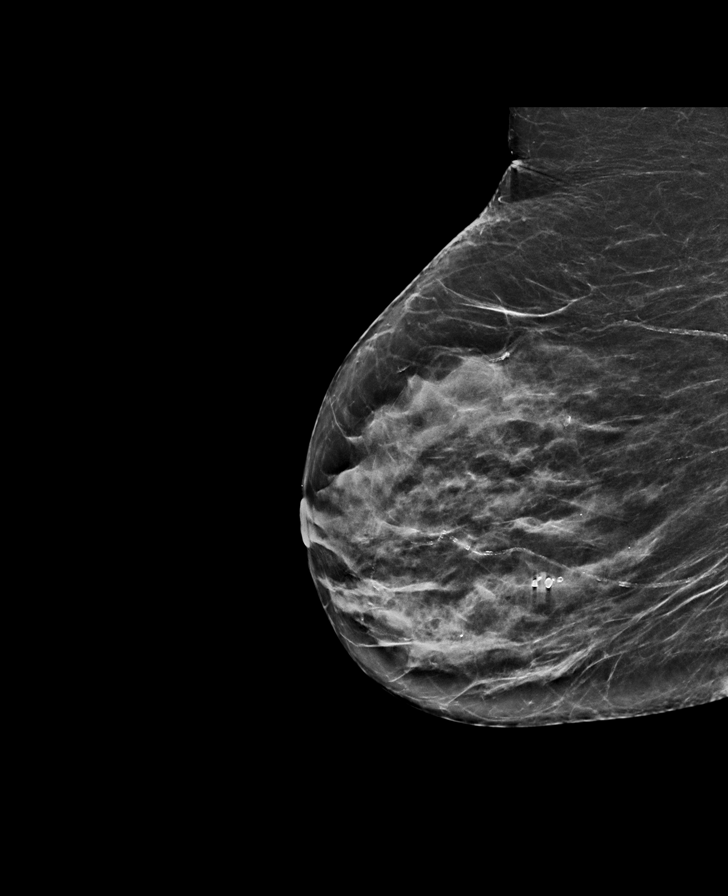

[L MLO (2 of 2)]
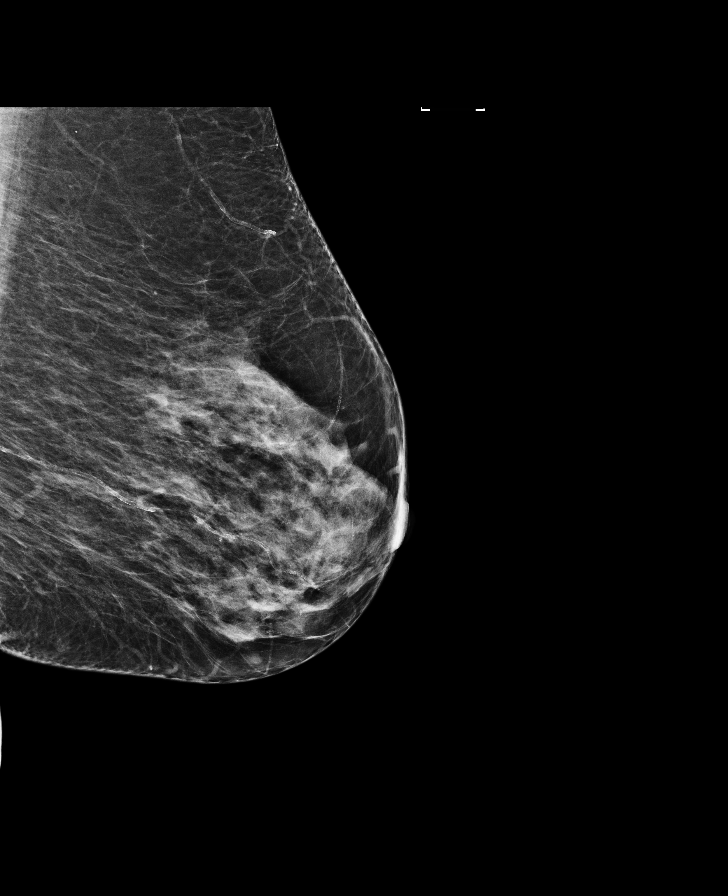

[8 of 30 positions shown; findings below may reference images not displayed]

ACR Breast Density Category c: The breast tissue is heterogeneously
dense, which may obscure small masses.
FINDINGS: There are no findings suspicious for malignancy. Images were
processed with CAD.
IMPRESSION: No mammographic evidence of malignancy. A result letter of this
screening mammogram will be mailed directly to the patient.

RECOMMENDATION:
Screening mammogram in one year. (Code:TN-[id number])

BI-RADS CATEGORY  1: Negative.

## 2017-04-26 DIAGNOSIS — L03115 Cellulitis of right lower limb: Secondary | ICD-10-CM | POA: Diagnosis not present

## 2017-05-05 DIAGNOSIS — M5136 Other intervertebral disc degeneration, lumbar region: Secondary | ICD-10-CM | POA: Diagnosis not present

## 2017-05-05 DIAGNOSIS — M5416 Radiculopathy, lumbar region: Secondary | ICD-10-CM | POA: Diagnosis not present

## 2017-05-10 DIAGNOSIS — L03115 Cellulitis of right lower limb: Secondary | ICD-10-CM | POA: Diagnosis not present

## 2017-06-06 ENCOUNTER — Other Ambulatory Visit: Payer: Self-pay

## 2017-06-06 DIAGNOSIS — I480 Paroxysmal atrial fibrillation: Secondary | ICD-10-CM

## 2017-06-06 MED ORDER — APIXABAN 5 MG PO TABS: 5.0000 mg | ORAL_TABLET | Freq: Two times a day (BID) | ORAL | 0 refills | Status: DC

## 2017-06-15 DIAGNOSIS — M47816 Spondylosis without myelopathy or radiculopathy, lumbar region: Secondary | ICD-10-CM | POA: Diagnosis not present

## 2017-06-15 DIAGNOSIS — M461 Sacroiliitis, not elsewhere classified: Secondary | ICD-10-CM | POA: Diagnosis not present

## 2017-06-15 DIAGNOSIS — M5416 Radiculopathy, lumbar region: Secondary | ICD-10-CM | POA: Diagnosis not present

## 2017-06-15 DIAGNOSIS — M5136 Other intervertebral disc degeneration, lumbar region: Secondary | ICD-10-CM | POA: Diagnosis not present

## 2017-06-21 DIAGNOSIS — R262 Difficulty in walking, not elsewhere classified: Secondary | ICD-10-CM | POA: Diagnosis not present

## 2017-06-21 DIAGNOSIS — M6281 Muscle weakness (generalized): Secondary | ICD-10-CM | POA: Diagnosis not present

## 2017-06-21 DIAGNOSIS — M47817 Spondylosis without myelopathy or radiculopathy, lumbosacral region: Secondary | ICD-10-CM | POA: Diagnosis not present

## 2017-06-23 DIAGNOSIS — M6281 Muscle weakness (generalized): Secondary | ICD-10-CM | POA: Diagnosis not present

## 2017-06-23 DIAGNOSIS — M47817 Spondylosis without myelopathy or radiculopathy, lumbosacral region: Secondary | ICD-10-CM | POA: Diagnosis not present

## 2017-06-23 DIAGNOSIS — R262 Difficulty in walking, not elsewhere classified: Secondary | ICD-10-CM | POA: Diagnosis not present

## 2017-06-28 DIAGNOSIS — R262 Difficulty in walking, not elsewhere classified: Secondary | ICD-10-CM | POA: Diagnosis not present

## 2017-06-28 DIAGNOSIS — M47817 Spondylosis without myelopathy or radiculopathy, lumbosacral region: Secondary | ICD-10-CM | POA: Diagnosis not present

## 2017-06-28 DIAGNOSIS — M6281 Muscle weakness (generalized): Secondary | ICD-10-CM | POA: Diagnosis not present

## 2017-06-30 DIAGNOSIS — R262 Difficulty in walking, not elsewhere classified: Secondary | ICD-10-CM | POA: Diagnosis not present

## 2017-06-30 DIAGNOSIS — M47817 Spondylosis without myelopathy or radiculopathy, lumbosacral region: Secondary | ICD-10-CM | POA: Diagnosis not present

## 2017-06-30 DIAGNOSIS — M6281 Muscle weakness (generalized): Secondary | ICD-10-CM | POA: Diagnosis not present

## 2017-07-04 DIAGNOSIS — M47817 Spondylosis without myelopathy or radiculopathy, lumbosacral region: Secondary | ICD-10-CM | POA: Diagnosis not present

## 2017-07-04 DIAGNOSIS — R262 Difficulty in walking, not elsewhere classified: Secondary | ICD-10-CM | POA: Diagnosis not present

## 2017-07-04 DIAGNOSIS — M6281 Muscle weakness (generalized): Secondary | ICD-10-CM | POA: Diagnosis not present

## 2017-07-05 ENCOUNTER — Other Ambulatory Visit: Payer: Self-pay | Admitting: Cardiovascular Disease

## 2017-07-05 DIAGNOSIS — I480 Paroxysmal atrial fibrillation: Secondary | ICD-10-CM

## 2017-07-05 NOTE — Telephone Encounter (Signed)
Please review for refill, Thanks !  

## 2017-07-06 ENCOUNTER — Other Ambulatory Visit: Payer: Self-pay

## 2017-07-06 NOTE — Progress Notes (Signed)
Pt last saw Dr. Mariah Milling 05/10/16. Pt is seeing cardiology in [place].  Refilled pt's Eliquis x 1 month w/ instructions to pharmacy that pt will need appt w/ Dr. Mariah Milling & BMET prior to further refills from our office.

## 2017-07-07 DIAGNOSIS — M6281 Muscle weakness (generalized): Secondary | ICD-10-CM | POA: Diagnosis not present

## 2017-07-07 DIAGNOSIS — R262 Difficulty in walking, not elsewhere classified: Secondary | ICD-10-CM | POA: Diagnosis not present

## 2017-07-07 DIAGNOSIS — M47817 Spondylosis without myelopathy or radiculopathy, lumbosacral region: Secondary | ICD-10-CM | POA: Diagnosis not present

## 2017-07-13 DIAGNOSIS — M47817 Spondylosis without myelopathy or radiculopathy, lumbosacral region: Secondary | ICD-10-CM | POA: Diagnosis not present

## 2017-07-13 DIAGNOSIS — M6281 Muscle weakness (generalized): Secondary | ICD-10-CM | POA: Diagnosis not present

## 2017-07-13 DIAGNOSIS — R262 Difficulty in walking, not elsewhere classified: Secondary | ICD-10-CM | POA: Diagnosis not present

## 2017-07-15 DIAGNOSIS — M6281 Muscle weakness (generalized): Secondary | ICD-10-CM | POA: Diagnosis not present

## 2017-07-15 DIAGNOSIS — M47817 Spondylosis without myelopathy or radiculopathy, lumbosacral region: Secondary | ICD-10-CM | POA: Diagnosis not present

## 2017-07-15 DIAGNOSIS — R262 Difficulty in walking, not elsewhere classified: Secondary | ICD-10-CM | POA: Diagnosis not present

## 2017-07-19 DIAGNOSIS — M47817 Spondylosis without myelopathy or radiculopathy, lumbosacral region: Secondary | ICD-10-CM | POA: Diagnosis not present

## 2017-07-19 DIAGNOSIS — R262 Difficulty in walking, not elsewhere classified: Secondary | ICD-10-CM | POA: Diagnosis not present

## 2017-07-19 DIAGNOSIS — M6281 Muscle weakness (generalized): Secondary | ICD-10-CM | POA: Diagnosis not present

## 2017-07-21 DIAGNOSIS — M6281 Muscle weakness (generalized): Secondary | ICD-10-CM | POA: Diagnosis not present

## 2017-07-21 DIAGNOSIS — R262 Difficulty in walking, not elsewhere classified: Secondary | ICD-10-CM | POA: Diagnosis not present

## 2017-07-21 DIAGNOSIS — M47817 Spondylosis without myelopathy or radiculopathy, lumbosacral region: Secondary | ICD-10-CM | POA: Diagnosis not present

## 2017-07-26 DIAGNOSIS — M6281 Muscle weakness (generalized): Secondary | ICD-10-CM | POA: Diagnosis not present

## 2017-07-26 DIAGNOSIS — R262 Difficulty in walking, not elsewhere classified: Secondary | ICD-10-CM | POA: Diagnosis not present

## 2017-07-26 DIAGNOSIS — M47817 Spondylosis without myelopathy or radiculopathy, lumbosacral region: Secondary | ICD-10-CM | POA: Diagnosis not present

## 2017-07-28 DIAGNOSIS — M6281 Muscle weakness (generalized): Secondary | ICD-10-CM | POA: Diagnosis not present

## 2017-07-28 DIAGNOSIS — R262 Difficulty in walking, not elsewhere classified: Secondary | ICD-10-CM | POA: Diagnosis not present

## 2017-07-28 DIAGNOSIS — M47817 Spondylosis without myelopathy or radiculopathy, lumbosacral region: Secondary | ICD-10-CM | POA: Diagnosis not present

## 2017-08-01 DIAGNOSIS — R262 Difficulty in walking, not elsewhere classified: Secondary | ICD-10-CM | POA: Diagnosis not present

## 2017-08-01 DIAGNOSIS — M6281 Muscle weakness (generalized): Secondary | ICD-10-CM | POA: Diagnosis not present

## 2017-08-01 DIAGNOSIS — M47817 Spondylosis without myelopathy or radiculopathy, lumbosacral region: Secondary | ICD-10-CM | POA: Diagnosis not present

## 2017-08-04 DIAGNOSIS — M6281 Muscle weakness (generalized): Secondary | ICD-10-CM | POA: Diagnosis not present

## 2017-08-04 DIAGNOSIS — M47817 Spondylosis without myelopathy or radiculopathy, lumbosacral region: Secondary | ICD-10-CM | POA: Diagnosis not present

## 2017-08-04 DIAGNOSIS — R262 Difficulty in walking, not elsewhere classified: Secondary | ICD-10-CM | POA: Diagnosis not present

## 2017-08-08 DIAGNOSIS — M6281 Muscle weakness (generalized): Secondary | ICD-10-CM | POA: Diagnosis not present

## 2017-08-08 DIAGNOSIS — M47817 Spondylosis without myelopathy or radiculopathy, lumbosacral region: Secondary | ICD-10-CM | POA: Diagnosis not present

## 2017-08-08 DIAGNOSIS — R262 Difficulty in walking, not elsewhere classified: Secondary | ICD-10-CM | POA: Diagnosis not present

## 2017-08-11 DIAGNOSIS — R262 Difficulty in walking, not elsewhere classified: Secondary | ICD-10-CM | POA: Diagnosis not present

## 2017-08-11 DIAGNOSIS — M6281 Muscle weakness (generalized): Secondary | ICD-10-CM | POA: Diagnosis not present

## 2017-08-11 DIAGNOSIS — M47817 Spondylosis without myelopathy or radiculopathy, lumbosacral region: Secondary | ICD-10-CM | POA: Diagnosis not present

## 2017-08-15 DIAGNOSIS — M6281 Muscle weakness (generalized): Secondary | ICD-10-CM | POA: Diagnosis not present

## 2017-08-15 DIAGNOSIS — M47817 Spondylosis without myelopathy or radiculopathy, lumbosacral region: Secondary | ICD-10-CM | POA: Diagnosis not present

## 2017-08-15 DIAGNOSIS — R262 Difficulty in walking, not elsewhere classified: Secondary | ICD-10-CM | POA: Diagnosis not present

## 2017-08-17 DIAGNOSIS — M47817 Spondylosis without myelopathy or radiculopathy, lumbosacral region: Secondary | ICD-10-CM | POA: Diagnosis not present

## 2017-08-17 DIAGNOSIS — R262 Difficulty in walking, not elsewhere classified: Secondary | ICD-10-CM | POA: Diagnosis not present

## 2017-08-17 DIAGNOSIS — M6281 Muscle weakness (generalized): Secondary | ICD-10-CM | POA: Diagnosis not present

## 2017-08-22 DIAGNOSIS — R262 Difficulty in walking, not elsewhere classified: Secondary | ICD-10-CM | POA: Diagnosis not present

## 2017-08-22 DIAGNOSIS — M6281 Muscle weakness (generalized): Secondary | ICD-10-CM | POA: Diagnosis not present

## 2017-08-22 DIAGNOSIS — M47817 Spondylosis without myelopathy or radiculopathy, lumbosacral region: Secondary | ICD-10-CM | POA: Diagnosis not present

## 2017-08-25 DIAGNOSIS — R262 Difficulty in walking, not elsewhere classified: Secondary | ICD-10-CM | POA: Diagnosis not present

## 2017-08-25 DIAGNOSIS — M47817 Spondylosis without myelopathy or radiculopathy, lumbosacral region: Secondary | ICD-10-CM | POA: Diagnosis not present

## 2017-08-25 DIAGNOSIS — M6281 Muscle weakness (generalized): Secondary | ICD-10-CM | POA: Diagnosis not present

## 2017-08-29 DIAGNOSIS — M47817 Spondylosis without myelopathy or radiculopathy, lumbosacral region: Secondary | ICD-10-CM | POA: Diagnosis not present

## 2017-08-29 DIAGNOSIS — R262 Difficulty in walking, not elsewhere classified: Secondary | ICD-10-CM | POA: Diagnosis not present

## 2017-08-29 DIAGNOSIS — M6281 Muscle weakness (generalized): Secondary | ICD-10-CM | POA: Diagnosis not present

## 2017-09-08 DIAGNOSIS — M6281 Muscle weakness (generalized): Secondary | ICD-10-CM | POA: Diagnosis not present

## 2017-09-08 DIAGNOSIS — M47817 Spondylosis without myelopathy or radiculopathy, lumbosacral region: Secondary | ICD-10-CM | POA: Diagnosis not present

## 2017-09-08 DIAGNOSIS — R262 Difficulty in walking, not elsewhere classified: Secondary | ICD-10-CM | POA: Diagnosis not present

## 2017-09-12 DIAGNOSIS — R262 Difficulty in walking, not elsewhere classified: Secondary | ICD-10-CM | POA: Diagnosis not present

## 2017-09-12 DIAGNOSIS — M6281 Muscle weakness (generalized): Secondary | ICD-10-CM | POA: Diagnosis not present

## 2017-09-12 DIAGNOSIS — M47817 Spondylosis without myelopathy or radiculopathy, lumbosacral region: Secondary | ICD-10-CM | POA: Diagnosis not present

## 2017-09-15 DIAGNOSIS — M47817 Spondylosis without myelopathy or radiculopathy, lumbosacral region: Secondary | ICD-10-CM | POA: Diagnosis not present

## 2017-09-15 DIAGNOSIS — M6281 Muscle weakness (generalized): Secondary | ICD-10-CM | POA: Diagnosis not present

## 2017-09-15 DIAGNOSIS — R262 Difficulty in walking, not elsewhere classified: Secondary | ICD-10-CM | POA: Diagnosis not present

## 2017-09-19 DIAGNOSIS — M461 Sacroiliitis, not elsewhere classified: Secondary | ICD-10-CM | POA: Diagnosis not present

## 2017-09-19 DIAGNOSIS — M5416 Radiculopathy, lumbar region: Secondary | ICD-10-CM | POA: Diagnosis not present

## 2017-09-19 DIAGNOSIS — M5136 Other intervertebral disc degeneration, lumbar region: Secondary | ICD-10-CM | POA: Diagnosis not present

## 2017-09-19 DIAGNOSIS — M47816 Spondylosis without myelopathy or radiculopathy, lumbar region: Secondary | ICD-10-CM | POA: Diagnosis not present

## 2017-09-20 DIAGNOSIS — M6281 Muscle weakness (generalized): Secondary | ICD-10-CM | POA: Diagnosis not present

## 2017-09-20 DIAGNOSIS — R262 Difficulty in walking, not elsewhere classified: Secondary | ICD-10-CM | POA: Diagnosis not present

## 2017-09-20 DIAGNOSIS — M47817 Spondylosis without myelopathy or radiculopathy, lumbosacral region: Secondary | ICD-10-CM | POA: Diagnosis not present

## 2017-09-22 DIAGNOSIS — M47817 Spondylosis without myelopathy or radiculopathy, lumbosacral region: Secondary | ICD-10-CM | POA: Diagnosis not present

## 2017-09-22 DIAGNOSIS — R262 Difficulty in walking, not elsewhere classified: Secondary | ICD-10-CM | POA: Diagnosis not present

## 2017-09-22 DIAGNOSIS — M6281 Muscle weakness (generalized): Secondary | ICD-10-CM | POA: Diagnosis not present

## 2017-09-28 DIAGNOSIS — M6281 Muscle weakness (generalized): Secondary | ICD-10-CM | POA: Diagnosis not present

## 2017-09-28 DIAGNOSIS — R262 Difficulty in walking, not elsewhere classified: Secondary | ICD-10-CM | POA: Diagnosis not present

## 2017-09-28 DIAGNOSIS — M47817 Spondylosis without myelopathy or radiculopathy, lumbosacral region: Secondary | ICD-10-CM | POA: Diagnosis not present

## 2017-10-05 DIAGNOSIS — M47817 Spondylosis without myelopathy or radiculopathy, lumbosacral region: Secondary | ICD-10-CM | POA: Diagnosis not present

## 2017-10-05 DIAGNOSIS — R262 Difficulty in walking, not elsewhere classified: Secondary | ICD-10-CM | POA: Diagnosis not present

## 2017-10-05 DIAGNOSIS — M6281 Muscle weakness (generalized): Secondary | ICD-10-CM | POA: Diagnosis not present

## 2018-01-01 ENCOUNTER — Other Ambulatory Visit: Payer: Self-pay | Admitting: Cardiovascular Disease

## 2018-01-01 DIAGNOSIS — I480 Paroxysmal atrial fibrillation: Secondary | ICD-10-CM

## 2018-01-02 NOTE — Telephone Encounter (Signed)
Refill Request.  

## 2018-01-04 ENCOUNTER — Other Ambulatory Visit: Payer: Self-pay | Admitting: Family Medicine

## 2018-01-04 DIAGNOSIS — Z1231 Encounter for screening mammogram for malignant neoplasm of breast: Secondary | ICD-10-CM

## 2018-02-09 ENCOUNTER — Ambulatory Visit
Admission: RE | Admit: 2018-02-09 | Discharge: 2018-02-09 | Disposition: A | Discharge status: Home / Self Care | Payer: Medicare HMO | Source: Ambulatory Visit | Attending: Family Medicine | Admitting: Family Medicine

## 2018-02-09 DIAGNOSIS — Z1231 Encounter for screening mammogram for malignant neoplasm of breast: Secondary | ICD-10-CM | POA: Insufficient documentation

## 2018-06-02 ENCOUNTER — Telehealth: Payer: Self-pay

## 2018-06-02 NOTE — Telephone Encounter (Signed)
Called patient from recall.  Someone answered, then hung up.  Nothing was said.  Need to schedule patient for EVISIT

## 2018-06-22 NOTE — Telephone Encounter (Signed)
Patient and patient daughter calling Patient now sees a more local cardiologist, will not need follow up [place] recall

## 2018-06-22 NOTE — Telephone Encounter (Signed)
Called patient from recall.  No answer. LMOV.  This is the second attempt per recall list.   

## 2021-03-06 DIAGNOSIS — I110 Hypertensive heart disease with heart failure: Secondary | ICD-10-CM | POA: Diagnosis not present

## 2021-03-06 DIAGNOSIS — I480 Paroxysmal atrial fibrillation: Secondary | ICD-10-CM | POA: Diagnosis not present

## 2021-03-06 DIAGNOSIS — I5032 Chronic diastolic (congestive) heart failure: Secondary | ICD-10-CM | POA: Diagnosis not present

## 2021-03-07 DIAGNOSIS — I5030 Unspecified diastolic (congestive) heart failure: Secondary | ICD-10-CM | POA: Diagnosis not present

## 2021-03-07 DIAGNOSIS — I361 Nonrheumatic tricuspid (valve) insufficiency: Secondary | ICD-10-CM | POA: Diagnosis not present

## 2021-03-07 DIAGNOSIS — I482 Chronic atrial fibrillation, unspecified: Secondary | ICD-10-CM | POA: Diagnosis not present

## 2021-03-07 DIAGNOSIS — I10 Essential (primary) hypertension: Secondary | ICD-10-CM | POA: Diagnosis not present

## 2021-03-07 DIAGNOSIS — I340 Nonrheumatic mitral (valve) insufficiency: Secondary | ICD-10-CM | POA: Diagnosis not present

## 2021-03-08 DIAGNOSIS — I10 Essential (primary) hypertension: Secondary | ICD-10-CM | POA: Diagnosis not present

## 2021-03-08 DIAGNOSIS — I5030 Unspecified diastolic (congestive) heart failure: Secondary | ICD-10-CM | POA: Diagnosis not present

## 2021-03-08 DIAGNOSIS — I482 Chronic atrial fibrillation, unspecified: Secondary | ICD-10-CM | POA: Diagnosis not present

## 2021-03-09 DIAGNOSIS — I5030 Unspecified diastolic (congestive) heart failure: Secondary | ICD-10-CM | POA: Diagnosis not present

## 2021-03-09 DIAGNOSIS — I482 Chronic atrial fibrillation, unspecified: Secondary | ICD-10-CM | POA: Diagnosis not present

## 2021-03-09 DIAGNOSIS — I10 Essential (primary) hypertension: Secondary | ICD-10-CM | POA: Diagnosis not present

## 2021-03-10 DIAGNOSIS — I5030 Unspecified diastolic (congestive) heart failure: Secondary | ICD-10-CM | POA: Diagnosis not present

## 2021-03-10 DIAGNOSIS — I10 Essential (primary) hypertension: Secondary | ICD-10-CM | POA: Diagnosis not present

## 2021-03-10 DIAGNOSIS — I482 Chronic atrial fibrillation, unspecified: Secondary | ICD-10-CM | POA: Diagnosis not present

## 2021-03-11 DIAGNOSIS — I5030 Unspecified diastolic (congestive) heart failure: Secondary | ICD-10-CM | POA: Diagnosis not present

## 2021-03-11 DIAGNOSIS — I482 Chronic atrial fibrillation, unspecified: Secondary | ICD-10-CM | POA: Diagnosis not present

## 2021-03-11 DIAGNOSIS — I10 Essential (primary) hypertension: Secondary | ICD-10-CM | POA: Diagnosis not present

## 2021-03-12 DIAGNOSIS — I482 Chronic atrial fibrillation, unspecified: Secondary | ICD-10-CM | POA: Diagnosis not present

## 2021-03-12 DIAGNOSIS — I5030 Unspecified diastolic (congestive) heart failure: Secondary | ICD-10-CM | POA: Diagnosis not present

## 2021-03-12 DIAGNOSIS — I10 Essential (primary) hypertension: Secondary | ICD-10-CM | POA: Diagnosis not present

## 2021-03-13 DIAGNOSIS — I10 Essential (primary) hypertension: Secondary | ICD-10-CM | POA: Diagnosis not present

## 2021-03-13 DIAGNOSIS — I482 Chronic atrial fibrillation, unspecified: Secondary | ICD-10-CM | POA: Diagnosis not present

## 2021-03-13 DIAGNOSIS — I5030 Unspecified diastolic (congestive) heart failure: Secondary | ICD-10-CM | POA: Diagnosis not present

## 2021-03-16 DIAGNOSIS — E785 Hyperlipidemia, unspecified: Secondary | ICD-10-CM | POA: Diagnosis not present

## 2021-03-16 DIAGNOSIS — I4891 Unspecified atrial fibrillation: Secondary | ICD-10-CM | POA: Diagnosis not present

## 2021-03-16 DIAGNOSIS — J449 Chronic obstructive pulmonary disease, unspecified: Secondary | ICD-10-CM | POA: Diagnosis not present

## 2021-03-16 DIAGNOSIS — I10 Essential (primary) hypertension: Secondary | ICD-10-CM | POA: Diagnosis not present

## 2021-03-17 DIAGNOSIS — I4891 Unspecified atrial fibrillation: Secondary | ICD-10-CM | POA: Diagnosis not present

## 2021-03-17 DIAGNOSIS — I10 Essential (primary) hypertension: Secondary | ICD-10-CM | POA: Diagnosis not present

## 2021-03-17 DIAGNOSIS — E785 Hyperlipidemia, unspecified: Secondary | ICD-10-CM | POA: Diagnosis not present

## 2021-03-17 DIAGNOSIS — J449 Chronic obstructive pulmonary disease, unspecified: Secondary | ICD-10-CM | POA: Diagnosis not present

## 2021-03-18 DIAGNOSIS — I10 Essential (primary) hypertension: Secondary | ICD-10-CM | POA: Diagnosis not present

## 2021-03-18 DIAGNOSIS — J449 Chronic obstructive pulmonary disease, unspecified: Secondary | ICD-10-CM | POA: Diagnosis not present

## 2021-03-18 DIAGNOSIS — I4891 Unspecified atrial fibrillation: Secondary | ICD-10-CM | POA: Diagnosis not present

## 2021-03-18 DIAGNOSIS — E785 Hyperlipidemia, unspecified: Secondary | ICD-10-CM | POA: Diagnosis not present

## 2021-03-19 DIAGNOSIS — I4891 Unspecified atrial fibrillation: Secondary | ICD-10-CM | POA: Diagnosis not present

## 2022-02-15 DEATH — deceased
